# Patient Record
Sex: Male | Born: 1960 | Race: White | Hispanic: No | Marital: Married | Smoking: Never smoker
Health system: Southern US, Community
[De-identification: ages and names within clinical notes are randomized; demographics above are authoritative.]

## PROBLEM LIST (undated history)

## (undated) DIAGNOSIS — I10 Essential (primary) hypertension: Secondary | ICD-10-CM

## (undated) DIAGNOSIS — R519 Headache, unspecified: Secondary | ICD-10-CM

## (undated) DIAGNOSIS — M542 Cervicalgia: Secondary | ICD-10-CM

## (undated) DIAGNOSIS — Z87442 Personal history of urinary calculi: Secondary | ICD-10-CM

## (undated) DIAGNOSIS — G4730 Sleep apnea, unspecified: Secondary | ICD-10-CM

## (undated) DIAGNOSIS — M1990 Unspecified osteoarthritis, unspecified site: Secondary | ICD-10-CM

## (undated) DIAGNOSIS — F419 Anxiety disorder, unspecified: Secondary | ICD-10-CM

## (undated) DIAGNOSIS — F329 Major depressive disorder, single episode, unspecified: Secondary | ICD-10-CM

## (undated) DIAGNOSIS — F32A Depression, unspecified: Secondary | ICD-10-CM

## (undated) DIAGNOSIS — A4901 Methicillin susceptible Staphylococcus aureus infection, unspecified site: Secondary | ICD-10-CM

## (undated) HISTORY — PX: SHOULDER SURGERY: SHX246

## (undated) HISTORY — PX: HAND SURGERY: SHX662

---
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
## 2019-01-05 ENCOUNTER — Ambulatory Visit (INDEPENDENT_AMBULATORY_CARE_PROVIDER_SITE_OTHER): Payer: Medicare PPO

## 2019-01-05 ENCOUNTER — Other Ambulatory Visit: Payer: Self-pay

## 2019-01-05 ENCOUNTER — Ambulatory Visit (INDEPENDENT_AMBULATORY_CARE_PROVIDER_SITE_OTHER): Payer: Medicare PPO | Admitting: Orthopaedic Surgery

## 2019-01-05 VITALS — Ht 72.0 in | Wt 260.0 lb

## 2019-01-05 DIAGNOSIS — M1612 Unilateral primary osteoarthritis, left hip: Secondary | ICD-10-CM | POA: Diagnosis not present

## 2019-01-05 DIAGNOSIS — M545 Low back pain, unspecified: Secondary | ICD-10-CM
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
## 2019-01-13 NOTE — Progress Notes (Signed)
[facility] [place], [address] AT [place] [address] Phone: [phone] Fax: [phone]      Your procedure is scheduled on June 22nd.  Report to [facility] Main Entrance "A" at 8:15 A.M., and check in at the Admitting office.  Call this number if you have problems the morning of surgery:  [phone]  Call [phone] if you have any questions prior to your surgery date Monday-Friday 8am-4pm    Remember:  Do not eat after midnight.  You may drink clear liquids until 7:15AM .   Clear liquids allowed are: Water, Non-Citrus Juices (without pulp), Carbonated Beverages, Clear Tea, Black Coffee Only, and Gatorade  Please complete your PRE-SURGERY ENSURE that was provided to you 3 hours prior to you surgery start time (finish by 7:15 AM).  Please, if able, drink it in one setting. DO NOT SIP.     Take these medicines the morning of surgery with A SIP OF WATER   Tylenol - if needed  Amlodipine (Norvasc)  Gabapentin (Neurontin)  Pravastatin (Pravachol)  7 days prior to surgery STOP taking any Aspirin (unless otherwise instructed by your surgeon), Aleve, Naproxen, Ibuprofen, Motrin, Advil, Goody's, BC's, all herbal medications, fish oil, and all vitamins.    The Morning of Surgery  Do not wear jewelry, make-up or nail polish.  Do not wear lotions, powders, colognes, or deodorant  Do not shave 48 hours prior to surgery.  Men may shave face and neck.  Do not bring valuables to the hospital.  [facility] is not responsible for any belongings or valuables.  If you are a smoker, DO NOT Smoke 24 hours prior to surgery IF you wear a CPAP at night please bring your mask, tubing, and machine the morning of surgery   Remember that you must have someone to transport you home after your surgery, and remain with you for 24 hours if you are discharged the same day.   Contacts, glasses,  hearing aids, dentures or bridgework may not be worn into surgery.    Leave your suitcase in the car.  After surgery it may be brought to your room.  For patients admitted to the hospital, discharge time will be determined by your treatment team.  Patients discharged the day of surgery will not be allowed to drive home.    Special instructions:   [facility] [place]- Preparing For Surgery  Before surgery, you can play an important role. Because skin is not sterile, your skin needs to be as free of germs as possible. You can reduce the number of germs on your skin by washing with CHG (chlorahexidine gluconate) Soap before surgery.  CHG is an antiseptic cleaner which kills germs and bonds with the skin to continue killing germs even after washing.    Oral Hygiene is also important to reduce your risk of infection.  Remember - BRUSH YOUR TEETH THE MORNING OF SURGERY WITH YOUR REGULAR TOOTHPASTE  Please do not use if you have an allergy to CHG or antibacterial soaps. If your skin becomes reddened/irritated stop using the CHG.  Do not shave (including legs and underarms) for at least 48 hours prior to first CHG shower. It is OK to shave your face.  Please follow these instructions carefully.   1. Shower the NIGHT BEFORE SURGERY and the MORNING OF SURGERY with CHG Soap.   2. If you chose to wash your hair, wash  your hair first as usual with your normal shampoo.  3. After you shampoo, rinse your hair and body thoroughly to remove the shampoo.  4. Use CHG as you would any other liquid soap. You can apply CHG directly to the skin and wash gently with a scrungie or a clean washcloth.   5. Apply the CHG Soap to your body ONLY FROM THE NECK DOWN.  Do not use on open wounds or open sores. Avoid contact with your eyes, ears, mouth and genitals (private parts). Wash Face and genitals (private parts)  with your normal soap.   6. Wash thoroughly, paying special attention to the area where your surgery will be  performed.  7. Thoroughly rinse your body with warm water from the neck down.  8. DO NOT shower/wash with your normal soap after using and rinsing off the CHG Soap.  9. Pat yourself dry with a CLEAN TOWEL.  10. Wear CLEAN PAJAMAS to bed the night before surgery, wear comfortable clothes the morning of surgery  11. Place CLEAN SHEETS on your bed the night of your first shower and DO NOT SLEEP WITH PETS.    Day of Surgery:  Do not apply any deodorants/lotions.  Please wear clean clothes to the hospital/surgery center.   Remember to brush your teeth WITH YOUR REGULAR TOOTHPASTE.   Please read over the following fact sheets that you were given.

## 2019-01-14 ENCOUNTER — Other Ambulatory Visit (HOSPITAL_COMMUNITY)
Admission: RE | Admit: 2019-01-14 | Discharge: 2019-01-14 | Disposition: A | Discharge status: Home / Self Care | Payer: Medicare PPO | Source: Ambulatory Visit | Attending: Orthopaedic Surgery | Admitting: Orthopaedic Surgery

## 2019-01-14 ENCOUNTER — Encounter (HOSPITAL_COMMUNITY)
Admission: RE | Admit: 2019-01-14 | Discharge: 2019-01-14 | Disposition: A | Discharge status: Home / Self Care | Payer: Medicare PPO | Source: Ambulatory Visit | Attending: Orthopaedic Surgery | Admitting: Orthopaedic Surgery

## 2019-01-14 ENCOUNTER — Other Ambulatory Visit: Payer: Self-pay

## 2019-01-14 ENCOUNTER — Encounter (HOSPITAL_COMMUNITY)
Admission: RE | Admit: 2019-01-14 | Discharge: 2019-01-14 | Disposition: A | Discharge status: Home / Self Care | Payer: Medicare PPO | Source: Ambulatory Visit | Attending: Physician Assistant | Admitting: Physician Assistant

## 2019-01-14 ENCOUNTER — Encounter (HOSPITAL_COMMUNITY): Payer: Self-pay

## 2019-01-14 DIAGNOSIS — M1612 Unilateral primary osteoarthritis, left hip: Secondary | ICD-10-CM | POA: Diagnosis not present

## 2019-01-14 DIAGNOSIS — J9811 Atelectasis: Secondary | ICD-10-CM | POA: Insufficient documentation

## 2019-01-14 DIAGNOSIS — Z01818 Encounter for other preprocedural examination: Secondary | ICD-10-CM | POA: Insufficient documentation

## 2019-01-14 HISTORY — DX: Anxiety disorder, unspecified: F41.9

## 2019-01-14 HISTORY — DX: Personal history of urinary calculi: Z87.442

## 2019-01-14 HISTORY — DX: Unspecified osteoarthritis, unspecified site: M19.90

## 2019-01-14 HISTORY — DX: Depression, unspecified: F32.A

## 2019-01-14 HISTORY — DX: Cervicalgia: M54.2

## 2019-01-14 HISTORY — DX: Sleep apnea, unspecified: G47.30

## 2019-01-14 LAB — COMPREHENSIVE METABOLIC PANEL
ALT: 24 U/L (ref 0–44)
AST: 23 U/L (ref 15–41)
Albumin: 4.4 g/dL (ref 3.5–5.0)
Alkaline Phosphatase: 84 U/L (ref 38–126)
Anion gap: 9 (ref 5–15)
BUN: 19 mg/dL (ref 6–20)
CO2: 24 mmol/L (ref 22–32)
Calcium: 9.5 mg/dL (ref 8.9–10.3)
Chloride: 105 mmol/L (ref 98–111)
Creatinine, Ser: 1.22 mg/dL (ref 0.61–1.24)
GFR calc Af Amer: >60 mL/min (ref >60)
GFR calc non Af Amer: >60 mL/min (ref >60)
Glucose, Bld: 106 mg/dL — ABNORMAL HIGH (ref 70–99)
Potassium: 4.6 mmol/L (ref 3.5–5.1)
Sodium: 138 mmol/L (ref 135–145)
Total Bilirubin: 1 mg/dL (ref 0.3–1.2)
Total Protein: 7.8 g/dL (ref 6.5–8.1)

## 2019-01-14 LAB — SARS CORONAVIRUS 2 (TAT 6-24 HRS): SARS Coronavirus 2: NEGATIVE

## 2019-01-14 LAB — CBC WITH DIFFERENTIAL/PLATELET
Abs Immature Granulocytes: 0.02 10*3/uL (ref 0.00–0.07)
Basophils Absolute: 0.1 10*3/uL (ref 0.0–0.1)
Basophils Relative: 1 %
Eosinophils Absolute: 0.2 10*3/uL (ref 0.0–0.5)
Eosinophils Relative: 3 %
HCT: 48 % (ref 39.0–52.0)
Hemoglobin: 14.8 g/dL (ref 13.0–17.0)
Immature Granulocytes: 0 %
Lymphocytes Relative: 29 %
Lymphs Abs: 2.4 10*3/uL (ref 0.7–4.0)
MCH: 27.8 pg (ref 26.0–34.0)
MCHC: 30.8 g/dL (ref 30.0–36.0)
MCV: 90.1 fL (ref 80.0–100.0)
Monocytes Absolute: 0.7 10*3/uL (ref 0.1–1.0)
Monocytes Relative: 9 %
Neutro Abs: 5 10*3/uL (ref 1.7–7.7)
Neutrophils Relative %: 58 %
Platelets: 255 10*3/uL (ref 150–400)
RBC: 5.33 MIL/uL (ref 4.22–5.81)
RDW: 13 % (ref 11.5–15.5)
WBC: 8.5 10*3/uL (ref 4.0–10.5)
nRBC: 0 % (ref 0.0–0.2)

## 2019-01-14 LAB — ABO/RH: ABO/RH(D): O NEG

## 2019-01-14 LAB — TYPE AND SCREEN
ABO/RH(D): O NEG
Antibody Screen: NEGATIVE

## 2019-01-14 LAB — PROTIME-INR
INR: 1 (ref 0.8–1.2)
Prothrombin Time: 13.4 seconds (ref 11.4–15.2)

## 2019-01-14 LAB — SURGICAL PCR SCREEN
MRSA, PCR: NEGATIVE
Staphylococcus aureus: POSITIVE — AB

## 2019-01-14 LAB — APTT: aPTT: 35 seconds (ref 24–36)

## 2019-01-14 NOTE — Progress Notes (Signed)
PCP - . Thornton Papas ,FNP Cardiologist - n/a  Chest x-ray - none EKG - none Stress Test - n/a ECHO - n/a Cardiac Cath - n/a  Sleep Study -yes  Positive home study   CPAP -to be discussed after surgery   Fasting Blood Sugar -n/a  Checks Blood Sugar _____ times a day  Blood Thinner Instructions:n/a Aspirin Instructions:on hold  Anesthesia review:   Patient denies shortness of breath, fever, cough and chest pain at PAT appointment   Patient verbalized understanding of instructions that were given to them at the PAT appointment. Patient was also instructed that they will need to review over the PAT instructions again at home before surgery.

## 2019-01-15 MED ORDER — DEXTROSE 5 % IV SOLN
3.0000 g | INTRAVENOUS | Status: AC
  Administered 2019-01-18: 3 g via INTRAVENOUS
  Filled 2019-01-15: qty 3

## 2019-01-15 MED ORDER — TRANEXAMIC ACID 1000 MG/10ML IV SOLN
2000.0000 mg | INTRAVENOUS | Status: AC
  Administered 2019-01-18: 2000 mg via TOPICAL
  Filled 2019-01-15: qty 20

## 2019-01-15 MED ORDER — TRANEXAMIC ACID-NACL 1000-0.7 MG/100ML-% IV SOLN
1000.0000 mg | INTRAVENOUS | Status: AC
  Administered 2019-01-18: 1000 mg via INTRAVENOUS
  Filled 2019-01-15: qty 100

## 2019-01-15 NOTE — Progress Notes (Signed)
Will you make sure mupirocin nasal has been called in by today

## 2019-01-18 ENCOUNTER — Ambulatory Visit (HOSPITAL_COMMUNITY): Payer: Medicare PPO | Admitting: Anesthesiology

## 2019-01-18 ENCOUNTER — Ambulatory Visit (HOSPITAL_COMMUNITY): Payer: Medicare PPO

## 2019-01-18 ENCOUNTER — Observation Stay (HOSPITAL_COMMUNITY)
Admission: RE | Admit: 2019-01-18 | Discharge: 2019-01-19 | Disposition: A | Discharge status: Home / Self Care | Payer: Medicare PPO | Attending: Orthopaedic Surgery | Admitting: Orthopaedic Surgery

## 2019-01-18 ENCOUNTER — Encounter (HOSPITAL_COMMUNITY): Payer: Self-pay

## 2019-01-18 ENCOUNTER — Ambulatory Visit (HOSPITAL_COMMUNITY): Payer: Medicare PPO | Admitting: Vascular Surgery

## 2019-01-18 ENCOUNTER — Other Ambulatory Visit: Payer: Self-pay

## 2019-01-18 ENCOUNTER — Encounter (HOSPITAL_COMMUNITY)
Admission: RE | Disposition: A | Discharge status: Home / Self Care | Payer: Self-pay | Source: Home / Self Care | Attending: Orthopaedic Surgery

## 2019-01-18 DIAGNOSIS — Z7982 Long term (current) use of aspirin: Secondary | ICD-10-CM | POA: Diagnosis not present

## 2019-01-18 DIAGNOSIS — F329 Major depressive disorder, single episode, unspecified: Secondary | ICD-10-CM | POA: Insufficient documentation

## 2019-01-18 DIAGNOSIS — G4730 Sleep apnea, unspecified: Secondary | ICD-10-CM | POA: Insufficient documentation

## 2019-01-18 DIAGNOSIS — Z96649 Presence of unspecified artificial hip joint: Secondary | ICD-10-CM

## 2019-01-18 DIAGNOSIS — Z79899 Other long term (current) drug therapy: Secondary | ICD-10-CM | POA: Diagnosis not present

## 2019-01-18 DIAGNOSIS — Z419 Encounter for procedure for purposes other than remedying health state, unspecified: Secondary | ICD-10-CM

## 2019-01-18 DIAGNOSIS — F419 Anxiety disorder, unspecified: Secondary | ICD-10-CM | POA: Insufficient documentation

## 2019-01-18 DIAGNOSIS — M1612 Unilateral primary osteoarthritis, left hip: Secondary | ICD-10-CM | POA: Diagnosis not present

## 2019-01-18 DIAGNOSIS — Z96642 Presence of left artificial hip joint: Secondary | ICD-10-CM

## 2019-01-18 HISTORY — PX: TOTAL HIP ARTHROPLASTY: SHX124

## 2019-01-18 HISTORY — DX: Essential (primary) hypertension: I10

## 2019-01-18 SURGERY — ARTHROPLASTY, HIP, TOTAL, ANTERIOR APPROACH
Anesthesia: General | Site: Hip | Laterality: Left

## 2019-01-18 MED ORDER — MAGNESIUM CITRATE PO SOLN: 1.0000 | Freq: Once | ORAL | Status: DC | PRN

## 2019-01-18 MED ORDER — METOCLOPRAMIDE HCL 5 MG PO TABS: 5.0000 mg | ORAL_TABLET | Freq: Three times a day (TID) | ORAL | Status: DC | PRN

## 2019-01-18 MED ORDER — OXYCODONE HCL 5 MG PO TABS: 10.0000 mg | ORAL_TABLET | ORAL | Status: DC | PRN

## 2019-01-18 MED ORDER — ONDANSETRON HCL 4 MG/2ML IJ SOLN: 4.0000 mg | Freq: Four times a day (QID) | INTRAMUSCULAR | Status: DC | PRN

## 2019-01-18 MED ORDER — ALUM & MAG HYDROXIDE-SIMETH 200-200-20 MG/5ML PO SUSP: 30.0000 mL | ORAL | Status: DC | PRN

## 2019-01-18 MED ORDER — TRANEXAMIC ACID-NACL 1000-0.7 MG/100ML-% IV SOLN
1000.0000 mg | Freq: Once | INTRAVENOUS | Status: AC
  Administered 2019-01-18: 1000 mg via INTRAVENOUS
  Filled 2019-01-18: qty 100

## 2019-01-18 MED ORDER — GABAPENTIN 400 MG PO CAPS
800.0000 mg | ORAL_CAPSULE | Freq: Four times a day (QID) | ORAL | Status: DC
  Administered 2019-01-18 – 2019-01-19 (×4): 800 mg via ORAL
  Filled 2019-01-18 (×4): qty 2

## 2019-01-18 MED ORDER — TRANEXAMIC ACID-NACL 1000-0.7 MG/100ML-% IV SOLN
1000.0000 mg | Freq: Once | INTRAVENOUS | Status: DC
  Filled 2019-01-18: qty 100

## 2019-01-18 MED ORDER — HYDROMORPHONE HCL 1 MG/ML IJ SOLN
0.5000 mg | INTRAMUSCULAR | Status: DC | PRN
  Administered 2019-01-18 – 2019-01-19 (×2): 1 mg via INTRAVENOUS
  Filled 2019-01-18 (×2): qty 1

## 2019-01-18 MED ORDER — GABAPENTIN 300 MG PO CAPS: 300.0000 mg | ORAL_CAPSULE | Freq: Three times a day (TID) | ORAL | Status: DC

## 2019-01-18 MED ORDER — PRAVASTATIN SODIUM 40 MG PO TABS
40.0000 mg | ORAL_TABLET | Freq: Every day | ORAL | Status: DC
  Administered 2019-01-19: 40 mg via ORAL
  Filled 2019-01-18: qty 1

## 2019-01-18 MED ORDER — ONDANSETRON HCL 4 MG PO TABS: 4.0000 mg | ORAL_TABLET | Freq: Four times a day (QID) | ORAL | Status: DC | PRN

## 2019-01-18 MED ORDER — OXYCODONE HCL ER 10 MG PO T12A
10.0000 mg | EXTENDED_RELEASE_TABLET | Freq: Two times a day (BID) | ORAL | Status: DC
  Filled 2019-01-18 (×2): qty 1

## 2019-01-18 MED ORDER — PHENOL 1.4 % MT LIQD: 1.0000 | OROMUCOSAL | Status: DC | PRN

## 2019-01-18 MED ORDER — SUCCINYLCHOLINE CHLORIDE 200 MG/10ML IV SOSY
PREFILLED_SYRINGE | INTRAVENOUS | Status: DC | PRN
  Administered 2019-01-18: 130 mg via INTRAVENOUS

## 2019-01-18 MED ORDER — LISINOPRIL 20 MG PO TABS
20.0000 mg | ORAL_TABLET | Freq: Every day | ORAL | Status: DC
  Administered 2019-01-19: 20 mg via ORAL
  Filled 2019-01-18: qty 1

## 2019-01-18 MED ORDER — SUCCINYLCHOLINE CHLORIDE 200 MG/10ML IV SOSY
PREFILLED_SYRINGE | INTRAVENOUS | Status: AC
  Filled 2019-01-18: qty 10

## 2019-01-18 MED ORDER — SODIUM CHLORIDE 0.9 % IV SOLN
INTRAVENOUS | Status: DC
  Administered 2019-01-18: 14:00:00 via INTRAVENOUS

## 2019-01-18 MED ORDER — KETOROLAC TROMETHAMINE 15 MG/ML IJ SOLN
30.0000 mg | Freq: Four times a day (QID) | INTRAMUSCULAR | Status: AC
  Administered 2019-01-18 – 2019-01-19 (×4): 30 mg via INTRAVENOUS
  Filled 2019-01-18 (×3): qty 2

## 2019-01-18 MED ORDER — AMLODIPINE BESYLATE 10 MG PO TABS
10.0000 mg | ORAL_TABLET | Freq: Every evening | ORAL | Status: DC
  Administered 2019-01-18: 18:00:00 10 mg via ORAL
  Filled 2019-01-18: qty 1

## 2019-01-18 MED ORDER — OXYCODONE HCL ER 10 MG PO T12A: 10.0000 mg | EXTENDED_RELEASE_TABLET | Freq: Two times a day (BID) | ORAL | 0 refills | Status: AC

## 2019-01-18 MED ORDER — OXYCODONE HCL 5 MG PO TABS
5.0000 mg | ORAL_TABLET | Freq: Once | ORAL | Status: AC | PRN
  Administered 2019-01-18: 5 mg via ORAL

## 2019-01-18 MED ORDER — ASPIRIN EC 81 MG PO TBEC: 81.0000 mg | DELAYED_RELEASE_TABLET | Freq: Two times a day (BID) | ORAL | 0 refills | Status: DC

## 2019-01-18 MED ORDER — OXYCODONE HCL 5 MG PO TABS: 5.0000 mg | ORAL_TABLET | Freq: Four times a day (QID) | ORAL | 0 refills | Status: DC | PRN

## 2019-01-18 MED ORDER — VANCOMYCIN HCL 1 G IV SOLR
INTRAVENOUS | Status: DC | PRN
  Administered 2019-01-18: 1000 mg via TOPICAL

## 2019-01-18 MED ORDER — ROCURONIUM BROMIDE 10 MG/ML (PF) SYRINGE
PREFILLED_SYRINGE | INTRAVENOUS | Status: AC
  Filled 2019-01-18: qty 10

## 2019-01-18 MED ORDER — OXYCODONE HCL 5 MG/5ML PO SOLN: 5.0000 mg | Freq: Once | ORAL | Status: AC | PRN

## 2019-01-18 MED ORDER — DIPHENHYDRAMINE HCL 12.5 MG/5ML PO ELIX: 25.0000 mg | ORAL_SOLUTION | ORAL | Status: DC | PRN

## 2019-01-18 MED ORDER — CHLORHEXIDINE GLUCONATE 4 % EX LIQD: 60.0000 mL | Freq: Once | CUTANEOUS | Status: DC

## 2019-01-18 MED ORDER — POLYETHYLENE GLYCOL 3350 17 G PO PACK: 17.0000 g | PACK | Freq: Every day | ORAL | Status: DC | PRN

## 2019-01-18 MED ORDER — SODIUM CHLORIDE 0.9 % IR SOLN
Status: DC | PRN
  Administered 2019-01-18: 3000 mL

## 2019-01-18 MED ORDER — PHENYLEPHRINE 40 MCG/ML (10ML) SYRINGE FOR IV PUSH (FOR BLOOD PRESSURE SUPPORT)
PREFILLED_SYRINGE | INTRAVENOUS | Status: DC | PRN
  Administered 2019-01-18: 120 ug via INTRAVENOUS

## 2019-01-18 MED ORDER — PROPOFOL 10 MG/ML IV BOLUS
INTRAVENOUS | Status: AC
  Filled 2019-01-18: qty 40

## 2019-01-18 MED ORDER — FENTANYL CITRATE (PF) 100 MCG/2ML IJ SOLN
INTRAMUSCULAR | Status: AC
  Filled 2019-01-18: qty 2

## 2019-01-18 MED ORDER — DULOXETINE HCL 30 MG PO CPEP
30.0000 mg | ORAL_CAPSULE | Freq: Every evening | ORAL | Status: DC
  Administered 2019-01-18: 18:00:00 30 mg via ORAL
  Filled 2019-01-18: qty 1

## 2019-01-18 MED ORDER — MENTHOL 3 MG MT LOZG: 1.0000 | LOZENGE | OROMUCOSAL | Status: DC | PRN

## 2019-01-18 MED ORDER — MIDAZOLAM HCL 2 MG/2ML IJ SOLN
INTRAMUSCULAR | Status: AC
  Filled 2019-01-18: qty 2

## 2019-01-18 MED ORDER — CEFAZOLIN SODIUM-DEXTROSE 2-4 GM/100ML-% IV SOLN
2.0000 g | Freq: Four times a day (QID) | INTRAVENOUS | Status: AC
  Administered 2019-01-18 – 2019-01-19 (×3): 2 g via INTRAVENOUS
  Filled 2019-01-18 (×3): qty 100

## 2019-01-18 MED ORDER — LACTATED RINGERS IV SOLN
INTRAVENOUS | Status: DC
  Administered 2019-01-18 (×2): via INTRAVENOUS

## 2019-01-18 MED ORDER — KETOROLAC TROMETHAMINE 30 MG/ML IJ SOLN
INTRAMUSCULAR | Status: AC
  Filled 2019-01-18: qty 1

## 2019-01-18 MED ORDER — DEXAMETHASONE SODIUM PHOSPHATE 10 MG/ML IJ SOLN
INTRAMUSCULAR | Status: DC | PRN
  Administered 2019-01-18: 10 mg via INTRAVENOUS

## 2019-01-18 MED ORDER — ACETAMINOPHEN 500 MG PO TABS
1000.0000 mg | ORAL_TABLET | Freq: Four times a day (QID) | ORAL | Status: AC
  Administered 2019-01-18 – 2019-01-19 (×4): 1000 mg via ORAL
  Filled 2019-01-18 (×4): qty 2

## 2019-01-18 MED ORDER — FENTANYL CITRATE (PF) 250 MCG/5ML IJ SOLN
INTRAMUSCULAR | Status: AC
  Filled 2019-01-18: qty 5

## 2019-01-18 MED ORDER — METOCLOPRAMIDE HCL 5 MG/ML IJ SOLN: 5.0000 mg | Freq: Three times a day (TID) | INTRAMUSCULAR | Status: DC | PRN

## 2019-01-18 MED ORDER — ACETAMINOPHEN 325 MG PO TABS
325.0000 mg | ORAL_TABLET | Freq: Four times a day (QID) | ORAL | Status: DC | PRN
  Administered 2019-01-19: 13:00:00 650 mg via ORAL
  Filled 2019-01-18: qty 2

## 2019-01-18 MED ORDER — PROMETHAZINE HCL 25 MG PO TABS: 25.0000 mg | ORAL_TABLET | Freq: Four times a day (QID) | ORAL | 1 refills | Status: DC | PRN

## 2019-01-18 MED ORDER — ONDANSETRON HCL 4 MG/2ML IJ SOLN
INTRAMUSCULAR | Status: AC
  Filled 2019-01-18: qty 2

## 2019-01-18 MED ORDER — VANCOMYCIN HCL 1000 MG IV SOLR
INTRAVENOUS | Status: AC
  Filled 2019-01-18: qty 1000

## 2019-01-18 MED ORDER — METHOCARBAMOL 1000 MG/10ML IJ SOLN
500.0000 mg | Freq: Four times a day (QID) | INTRAVENOUS | Status: DC | PRN
  Filled 2019-01-18: qty 5

## 2019-01-18 MED ORDER — GABAPENTIN 800 MG PO TABS
800.0000 mg | ORAL_TABLET | Freq: Four times a day (QID) | ORAL | Status: DC
  Filled 2019-01-18 (×3): qty 1

## 2019-01-18 MED ORDER — LIDOCAINE 2% (20 MG/ML) 5 ML SYRINGE
INTRAMUSCULAR | Status: AC
  Filled 2019-01-18: qty 5

## 2019-01-18 MED ORDER — ROCURONIUM BROMIDE 10 MG/ML (PF) SYRINGE
PREFILLED_SYRINGE | INTRAVENOUS | Status: DC | PRN
  Administered 2019-01-18: 50 mg via INTRAVENOUS
  Administered 2019-01-18: 10 mg via INTRAVENOUS

## 2019-01-18 MED ORDER — MIDAZOLAM HCL 5 MG/5ML IJ SOLN
INTRAMUSCULAR | Status: DC | PRN
  Administered 2019-01-18: 2 mg via INTRAVENOUS

## 2019-01-18 MED ORDER — DEXAMETHASONE SODIUM PHOSPHATE 10 MG/ML IJ SOLN
INTRAMUSCULAR | Status: AC
  Filled 2019-01-18: qty 1

## 2019-01-18 MED ORDER — 0.9 % SODIUM CHLORIDE (POUR BTL) OPTIME
TOPICAL | Status: DC | PRN
  Administered 2019-01-18: 1000 mL

## 2019-01-18 MED ORDER — ASPIRIN 81 MG PO CHEW
81.0000 mg | CHEWABLE_TABLET | Freq: Two times a day (BID) | ORAL | Status: DC
  Administered 2019-01-18 – 2019-01-19 (×2): 81 mg via ORAL
  Filled 2019-01-18 (×2): qty 1

## 2019-01-18 MED ORDER — ONDANSETRON HCL 4 MG PO TABS: 4.0000 mg | ORAL_TABLET | Freq: Three times a day (TID) | ORAL | 0 refills | Status: DC | PRN

## 2019-01-18 MED ORDER — METHOCARBAMOL 750 MG PO TABS: 750.0000 mg | ORAL_TABLET | Freq: Two times a day (BID) | ORAL | 0 refills | Status: DC | PRN

## 2019-01-18 MED ORDER — PROPOFOL 10 MG/ML IV BOLUS
INTRAVENOUS | Status: DC | PRN
  Administered 2019-01-18: 200 mg via INTRAVENOUS

## 2019-01-18 MED ORDER — OXYCODONE HCL 5 MG PO TABS
ORAL_TABLET | ORAL | Status: AC
  Filled 2019-01-18: qty 1

## 2019-01-18 MED ORDER — POVIDONE-IODINE 10 % EX SWAB: 2.0000 "application " | Freq: Once | CUTANEOUS | Status: DC

## 2019-01-18 MED ORDER — DEXAMETHASONE SODIUM PHOSPHATE 10 MG/ML IJ SOLN
10.0000 mg | Freq: Once | INTRAMUSCULAR | Status: AC
  Administered 2019-01-19: 10 mg via INTRAVENOUS
  Filled 2019-01-18: qty 1

## 2019-01-18 MED ORDER — SALINE SPRAY 0.65 % NA SOLN
1.0000 | NASAL | Status: DC | PRN
  Filled 2019-01-18: qty 44

## 2019-01-18 MED ORDER — FENTANYL CITRATE (PF) 100 MCG/2ML IJ SOLN
INTRAMUSCULAR | Status: DC | PRN
  Administered 2019-01-18 (×2): 50 ug via INTRAVENOUS
  Administered 2019-01-18: 100 ug via INTRAVENOUS
  Administered 2019-01-18: 50 ug via INTRAVENOUS

## 2019-01-18 MED ORDER — OXYCODONE HCL 5 MG PO TABS: 5.0000 mg | ORAL_TABLET | ORAL | Status: DC | PRN

## 2019-01-18 MED ORDER — SENNOSIDES-DOCUSATE SODIUM 8.6-50 MG PO TABS: 1.0000 | ORAL_TABLET | Freq: Every evening | ORAL | 1 refills | Status: DC | PRN

## 2019-01-18 MED ORDER — DOCUSATE SODIUM 100 MG PO CAPS
100.0000 mg | ORAL_CAPSULE | Freq: Two times a day (BID) | ORAL | Status: DC
  Administered 2019-01-18 – 2019-01-19 (×3): 100 mg via ORAL
  Filled 2019-01-18 (×3): qty 1

## 2019-01-18 MED ORDER — FENTANYL CITRATE (PF) 100 MCG/2ML IJ SOLN
25.0000 ug | INTRAMUSCULAR | Status: DC | PRN
  Administered 2019-01-18 (×3): 50 ug via INTRAVENOUS

## 2019-01-18 MED ORDER — SUGAMMADEX SODIUM 200 MG/2ML IV SOLN
INTRAVENOUS | Status: DC | PRN
  Administered 2019-01-18: 200 mg via INTRAVENOUS

## 2019-01-18 MED ORDER — LIDOCAINE 2% (20 MG/ML) 5 ML SYRINGE
INTRAMUSCULAR | Status: DC | PRN
  Administered 2019-01-18: 50 mg via INTRAVENOUS

## 2019-01-18 MED ORDER — SODIUM CHLORIDE 0.9 % IV SOLN
INTRAVENOUS | Status: DC | PRN
  Administered 2019-01-18: 10:00:00 50 ug/min via INTRAVENOUS

## 2019-01-18 MED ORDER — METHOCARBAMOL 500 MG PO TABS
ORAL_TABLET | ORAL | Status: AC
  Filled 2019-01-18: qty 1

## 2019-01-18 MED ORDER — METHOCARBAMOL 500 MG PO TABS
500.0000 mg | ORAL_TABLET | Freq: Four times a day (QID) | ORAL | Status: DC | PRN
  Administered 2019-01-18: 13:00:00 500 mg via ORAL

## 2019-01-18 MED ORDER — SORBITOL 70 % SOLN: 30.0000 mL | Freq: Every day | Status: DC | PRN

## 2019-01-18 MED ORDER — ONDANSETRON HCL 4 MG/2ML IJ SOLN
INTRAMUSCULAR | Status: DC | PRN
  Administered 2019-01-18: 4 mg via INTRAVENOUS

## 2019-01-18 SURGICAL SUPPLY — 58 items
ACETAB CUP W/GRIPTION 54 (Plate) ×2 IMPLANT
BAG DECANTER FOR FLEXI CONT (MISCELLANEOUS) ×2 IMPLANT
CELLS DAT CNTRL 66122 CELL SVR (MISCELLANEOUS) IMPLANT
COVER PERINEAL POST (MISCELLANEOUS) ×2 IMPLANT
COVER SURGICAL LIGHT HANDLE (MISCELLANEOUS) ×2 IMPLANT
COVER WAND RF STERILE (DRAPES) ×2 IMPLANT
CUP ACETAB W/GRIPTION 54 (Plate) IMPLANT
DRAPE C-ARM 42X72 X-RAY (DRAPES) ×2 IMPLANT
DRAPE POUCH INSTRU U-SHP 10X18 (DRAPES) ×2 IMPLANT
DRAPE STERI IOBAN 125X83 (DRAPES) ×2 IMPLANT
DRAPE U-SHAPE 47X51 STRL (DRAPES) ×4 IMPLANT
DRSG AQUACEL AG ADV 3.5X10 (GAUZE/BANDAGES/DRESSINGS) ×2 IMPLANT
DURAPREP 26ML APPLICATOR (WOUND CARE) ×4 IMPLANT
ELECT BLADE 4.0 EZ CLEAN MEGAD (MISCELLANEOUS) ×2
ELECT REM PT RETURN 9FT ADLT (ELECTROSURGICAL) ×2
ELECTRODE BLDE 4.0 EZ CLN MEGD (MISCELLANEOUS) ×1 IMPLANT
ELECTRODE REM PT RTRN 9FT ADLT (ELECTROSURGICAL) ×1 IMPLANT
GLOVE BIOGEL PI IND STRL 7.0 (GLOVE) ×1 IMPLANT
GLOVE BIOGEL PI INDICATOR 7.0 (GLOVE) ×1
GLOVE ECLIPSE 7.0 STRL STRAW (GLOVE) ×4 IMPLANT
GLOVE SKINSENSE NS SZ7.5 (GLOVE) ×1
GLOVE SKINSENSE STRL SZ7.5 (GLOVE) ×1 IMPLANT
GLOVE SURG SYN 7.5  E (GLOVE) ×4
GLOVE SURG SYN 7.5 E (GLOVE) ×4 IMPLANT
GLOVE SURG SYN 7.5 PF PI (GLOVE) ×4 IMPLANT
GOWN SRG XL XLNG 56XLVL 4 (GOWN DISPOSABLE) ×1 IMPLANT
GOWN STRL NON-REIN XL XLG LVL4 (GOWN DISPOSABLE) ×1
GOWN STRL REUS W/ TWL LRG LVL3 (GOWN DISPOSABLE) IMPLANT
GOWN STRL REUS W/ TWL XL LVL3 (GOWN DISPOSABLE) ×1 IMPLANT
GOWN STRL REUS W/TWL LRG LVL3 (GOWN DISPOSABLE)
GOWN STRL REUS W/TWL XL LVL3 (GOWN DISPOSABLE) ×1
HANDPIECE INTERPULSE COAX TIP (DISPOSABLE) ×1
HEAD CERAMIC DELTA 36 PLUS 1.5 (Hips) ×1 IMPLANT
HOOD PEEL AWAY FLYTE STAYCOOL (MISCELLANEOUS) ×4 IMPLANT
IV NS IRRIG 3000ML ARTHROMATIC (IV SOLUTION) ×2 IMPLANT
KIT BASIN OR (CUSTOM PROCEDURE TRAY) ×2 IMPLANT
LINER NEUTRAL 54X36MM PLUS 4 (Hips) ×1 IMPLANT
MARKER SKIN DUAL TIP RULER LAB (MISCELLANEOUS) ×2 IMPLANT
NDL SPNL 18GX3.5 QUINCKE PK (NEEDLE) ×1 IMPLANT
NEEDLE SPNL 18GX3.5 QUINCKE PK (NEEDLE) ×2 IMPLANT
PACK TOTAL JOINT (CUSTOM PROCEDURE TRAY) ×2 IMPLANT
PACK UNIVERSAL I (CUSTOM PROCEDURE TRAY) ×2 IMPLANT
RETRACTOR WND ALEXIS 18 MED (MISCELLANEOUS) IMPLANT
RTRCTR WOUND ALEXIS 18CM MED (MISCELLANEOUS)
SAW OSC TIP CART 19.5X105X1.3 (SAW) ×2 IMPLANT
SCREW 6.5MMX25MM (Screw) ×1 IMPLANT
SET HNDPC FAN SPRY TIP SCT (DISPOSABLE) ×1 IMPLANT
STAPLER VISISTAT 35W (STAPLE) IMPLANT
STEM FEMORAL SZ6 HIGH ACTIS (Stem) ×1 IMPLANT
SUT ETHIBOND 2 V 37 (SUTURE) ×2 IMPLANT
SUT VIC AB 1 CTX 36 (SUTURE) ×1
SUT VIC AB 1 CTX36XBRD ANBCTR (SUTURE) ×1 IMPLANT
SUT VIC AB 2-0 CT1 27 (SUTURE) ×2
SUT VIC AB 2-0 CT1 TAPERPNT 27 (SUTURE) ×2 IMPLANT
SYRINGE 60CC LL (MISCELLANEOUS) ×2 IMPLANT
TOWEL OR 17X26 10 PK STRL BLUE (TOWEL DISPOSABLE) ×2 IMPLANT
TRAY CATH 16FR W/PLASTIC CATH (SET/KITS/TRAYS/PACK) IMPLANT
YANKAUER SUCT BULB TIP NO VENT (SUCTIONS) ×2 IMPLANT

## 2019-01-18 NOTE — Social Work (Signed)
CSW acknowledging consult for SNF placement. Will follow for therapy recommendations.   Daley Mooradian, MSW, LCSWA [place] Clinical Social Work [phone]   

## 2019-01-18 NOTE — Op Note (Signed)
LEFT TOTAL HIP ARTHROPLASTY ANTERIOR APPROACH  Procedure Note Dylan [name]   [id number]  Pre-op Diagnosis: left hip degenerative joint disease     Post-op Diagnosis: same   Operative Procedures  1. Total hip replacement; Left hip; uncemented [id number]   Personnel  Surgeon(s): Tarry KosXu, Anahid Eskelson M, MD  Assist: Oneal GroutMary Lindsey Stanbery, PA-C; necessary for the timely completion of procedure and due to complexity of procedure.   Anesthesia: general  Prosthesis: Depuy Acetabulum: Pinnacle 54 mm Femur: Actis 6 HO Head: 36 mm size: +1.5 Liner: +4 neutral Bearing Type: ceramic on poly  Total Hip Arthroplasty (Anterior Approach) Op Note:  After informed consent was obtained and the operative extremity marked in the holding area, the patient was brought back to the operating room and placed supine on the HANA table. Next, the operative extremity was prepped and draped in normal sterile fashion. Surgical timeout occurred verifying patient identification, surgical site, surgical procedure and administration of antibiotics.  A modified anterior Smith-Peterson approach to the hip was performed, using the interval between tensor fascia lata and sartorius.  Dissection was carried bluntly down onto the anterior hip capsule. The lateral femoral circumflex vessels were identified and coagulated. A capsulotomy was performed and the capsular flaps tagged for later repair.  Fluoroscopy was utilized to prepare for the femoral neck cut. The neck osteotomy was performed. The femoral head was removed, the acetabular rim was cleared of soft tissue and attention was turned to reaming the acetabulum.  Sequential reaming was performed under fluoroscopic guidance. We reamed to a size 53 mm, and then impacted the acetabular shell. The liner was then placed after irrigation and attention turned to the femur.  After placing the femoral hook, the leg was taken to externally rotated, extended and adducted position taking care  to perform soft tissue releases to allow for adequate mobilization of the femur. Soft tissue was cleared from the shoulder of the greater trochanter and the hook elevator used to improve exposure of the proximal femur. Sequential broaching performed up to a size 6. Trial neck and head were placed. The leg was brought back up to neutral and the construct reduced. The position and sizing of components, offset and leg lengths were checked using fluoroscopy. Stability of the  construct was checked in extension and external rotation without any subluxation or impingement of prosthesis. We dislocated the prosthesis, dropped the leg back into position, removed trial components, and irrigated copiously. The final stem and head was then placed, the leg brought back up, the system reduced and fluoroscopy used to verify positioning.  We irrigated, obtained hemostasis and closed the capsule using #2 ethibond suture.  One gram of vancomycin powder was placed in the surgical bed. The fascia was closed with #1 vicryl plus, the deep fat layer was closed with 0 vicryl, the subcutaneous layers closed with 2.0 Vicryl Plus and the skin closed with 4.0 monocryl and steri strips. A sterile dressing was applied. The patient was awakened in the operating room and taken to recovery in stable condition.  All sponge, needle, and instrument counts were correct at the end of the case.   Position: supine  Complications: see description of procedure.  Time Out: performed   Drains/Packing: none  Estimated blood loss: see anesthesia record  Returned to Recovery Room: in good condition.   Antibiotics: yes   Mechanical VTE (DVT) Prophylaxis: sequential compression devices, TED thigh-high  Chemical VTE (DVT) Prophylaxis: aspirin   Fluid Replacement: see anesthesia record  Specimens Removed:  1 to pathology   Sponge and Instrument Count Correct? yes   PACU: portable radiograph - low AP   Plan/RTC: Return in 2 weeks for staple  removal. Weight Bearing/Load Lower Extremity: full  Hip precautions: none Suture Removal: 2 weeks   N. Eduard Roux, MD [place] 11:24 AM   Implant Name Type Inv. Item Serial No. Manufacturer Lot No. LRB No. Used Action  SCREW 6.5MMX25MM - [id number] Screw SCREW 6.5MMX25MM  DEPUY SYNTHES [id number] Left 1 Implanted  LINER NEUTRAL 54X36MM PLUS 4 - [id number] Hips LINER NEUTRAL 54X36MM PLUS 4  DEPUY SYNTHES [id number] Left 1 Implanted  ACETABULAR CUP W GRIPTION 54MM - [id number] Plate ACETABULAR CUP W GRIPTION 54MM  DEPUY SYNTHES [id number] Left 1 Implanted  HEAD CERAMIC DELTA 36 PLUS 1.5 - [id number] Hips HEAD CERAMIC DELTA 36 PLUS 1.5  DEPUY SYNTHES [id number] Left 1 Implanted  STEM FEMORAL SZ6 HIGH ACTIS - [id number] Stem STEM FEMORAL SZ6 HIGH ACTIS  [employer] DEPUY SPINE [id number] Left 1 Implanted

## 2019-01-18 NOTE — Transfer of Care (Signed)
Immediate Anesthesia Transfer of Care Note  Patient: Dylan [name]  Procedure(s) Performed: LEFT TOTAL HIP ARTHROPLASTY ANTERIOR APPROACH (Left Hip)  Patient Location: PACU  Anesthesia Type:General  Level of Consciousness: awake, oriented and patient cooperative  Airway & Oxygen Therapy: Patient Spontanous Breathing and Patient connected to face mask oxygen  Post-op Assessment: Report given to RN and Post -op Vital signs reviewed and stable  Post vital signs: Reviewed  Last Vitals:  Vitals Value Taken Time  BP 119/70 01/18/19 1203  Temp 36.2 C 01/18/19 1203  Pulse 94 01/18/19 1205  Resp 14 01/18/19 1205  SpO2 96 % 01/18/19 1205  Vitals shown include unvalidated device data.  Last Pain:  Vitals:   01/18/19 1203  TempSrc:   PainSc: (P) 7       Patients Stated Pain Goal: (P) 3 (38/75/64 3329)  Complications: No apparent anesthesia complications
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
## 2019-01-18 NOTE — Evaluation (Signed)
Physical Therapy Evaluation Patient Details Name: Dylan [name] MRN: [id number] DOB: Oct 11, 1960 Today's Date: 01/18/2019   History of Present Illness  Pt is a 58 y/o male s/p elective L THA, direct anterior approach. PMH includes sleep apnea.   Clinical Impression  Pt is s/p surgery above with deficits below. Pt tolerated gait training well and required min guard A for mobility tasks using RW. Will continue to follow acutely to maximize functional mobility independence and safety.     Follow Up Recommendations Follow surgeon's recommendation for DC plan and follow-up therapies;Supervision for mobility/OOB    Equipment Recommendations  Rolling walker with 5" wheels;3in1 (PT)    Recommendations for Other Services       Precautions / Restrictions Precautions Precautions: None Restrictions Weight Bearing Restrictions: Yes RLE Weight Bearing: Weight bearing as tolerated LLE Weight Bearing: Weight bearing as tolerated      Mobility  Bed Mobility Overal bed mobility: Needs Assistance Bed Mobility: Supine to Sit;Sit to Supine     Supine to sit: Supervision Sit to supine: Min assist   General bed mobility comments: Supervision and increased time when coming to EOB. Min A for LLE assist for return to supine.   Transfers Overall transfer level: Needs assistance Equipment used: Rolling walker (2 wheeled) Transfers: Sit to/from Stand Sit to Stand: Min guard         General transfer comment: Min guard for safety. Cues for safe hand placement.   Ambulation/Gait Ambulation/Gait assistance: Min guard Gait Distance (Feet): 100 Feet Assistive device: Rolling walker (2 wheeled) Gait Pattern/deviations: Step-to pattern;Step-through pattern;Decreased step length - left;Decreased step length - right;Decreased weight shift to left;Antalgic Gait velocity: Decreased    General Gait Details: Slow, antalgic gait. Cues for sequencing using RW. Improved weightshift to LLE noted at end of  gait.   Stairs            Wheelchair Mobility    Modified Rankin (Stroke Patients Only)       Balance Overall balance assessment: Needs assistance Sitting-balance support: No upper extremity supported;Feet supported Sitting balance-Leahy Scale: Good     Standing balance support: Bilateral upper extremity supported;During functional activity Standing balance-Leahy Scale: Poor Standing balance comment: Reliant on BUE support                              Pertinent Vitals/Pain Pain Assessment: Faces Faces Pain Scale: Hurts little more Pain Location: L hip  Pain Descriptors / Indicators: Aching;Operative site guarding Pain Intervention(s): Limited activity within patient's tolerance;Monitored during session;Repositioned    Home Living Family/patient expects to be discharged to:: Private residence Living Arrangements: Spouse/significant other Available Help at Discharge: Family;Available 24 hours/day Type of Home: House Home Access: Stairs to enter Entrance Stairs-Rails: Psychiatric nurse of Steps: 3 Home Layout: One level Home Equipment: None      Prior Function Level of Independence: Independent               Hand Dominance        Extremity/Trunk Assessment   Upper Extremity Assessment Upper Extremity Assessment: Overall WFL for tasks assessed    Lower Extremity Assessment Lower Extremity Assessment: LLE deficits/detail LLE Deficits / Details: Deficits consistent with post op pain and weakness.     Cervical / Trunk Assessment Cervical / Trunk Assessment: Normal  Communication   Communication: No difficulties  Cognition Arousal/Alertness: Awake/alert Behavior During Therapy: WFL for tasks assessed/performed Overall Cognitive Status: Within Functional Limits for  tasks assessed                                        General Comments      Exercises     Assessment/Plan    PT Assessment Patient  needs continued PT services  PT Problem List Decreased strength;Decreased balance;Decreased mobility;Decreased knowledge of use of DME;Decreased knowledge of precautions;Pain       PT Treatment Interventions DME instruction;Gait training;Functional mobility training;Therapeutic activities;Stair training;Therapeutic exercise;Balance training;Patient/family education    PT Goals (Current goals can be found in the Care Plan section)  Acute Rehab PT Goals Patient Stated Goal: to go home PT Goal Formulation: With patient Time For Goal Achievement: 02/01/19 Potential to Achieve Goals: Good    Frequency 7X/week   Barriers to discharge        Co-evaluation               AM-PAC PT "6 Clicks" Mobility  Outcome Measure Help needed turning from your back to your side while in a flat bed without using bedrails?: A Little Help needed moving from lying on your back to sitting on the side of a flat bed without using bedrails?: A Little Help needed moving to and from a bed to a chair (including a wheelchair)?: A Little Help needed standing up from a chair using your arms (e.g., wheelchair or bedside chair)?: A Little Help needed to walk in hospital room?: A Little Help needed climbing 3-5 steps with a railing? : A Lot 6 Click Score: 17    End of Session Equipment Utilized During Treatment: Gait belt Activity Tolerance: Patient tolerated treatment well Patient left: in bed;with call bell/phone within reach Nurse Communication: Mobility status PT Visit Diagnosis: Other abnormalities of gait and mobility (R26.89);Pain Pain - Right/Left: Left Pain - part of body: Hip    Time: 1455-1516 PT Time Calculation (min) (ACUTE ONLY): 21 min   Charges:   PT Evaluation $PT Eval Low Complexity: 1 Low          Gladys DammeBrittany Ewin Rehberg, PT, DPT  Acute Rehabilitation Services  Pager: [phone] Office: [phone]   Lehman PromBrittany S Anyjah Roundtree 01/18/2019, 4:27 PM

## 2019-01-18 NOTE — Anesthesia Postprocedure Evaluation (Signed)
Anesthesia Post Note  Patient: Dylan [name]  Procedure(s) Performed: LEFT TOTAL HIP ARTHROPLASTY ANTERIOR APPROACH (Left Hip)     Patient location during evaluation: PACU Anesthesia Type: General Level of consciousness: awake and alert Pain management: pain level controlled Vital Signs Assessment: post-procedure vital signs reviewed and stable Respiratory status: spontaneous breathing, nonlabored ventilation, respiratory function stable and patient connected to nasal cannula oxygen Cardiovascular status: blood pressure returned to baseline and stable Postop Assessment: no apparent nausea or vomiting Anesthetic complications: no    Last Vitals:  Vitals:   01/18/19 1307 01/18/19 1327  BP: 137/88 (!) 142/93  Pulse: 95 98  Resp: 12 16  Temp: (!) 36.3 C 36.4 C  SpO2: 96% 94%    Last Pain:  Vitals:   01/18/19 1327  TempSrc: Oral  PainSc:                  Alfa Leibensperger DAVID
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
## 2019-01-18 NOTE — Discharge Instructions (Signed)

## 2019-01-18 NOTE — Plan of Care (Signed)
  Problem: Clinical Measurements: Goal: Ability to maintain clinical measurements within normal limits will improve Outcome: Progressing   Problem: Coping: Goal: Level of anxiety will decrease Outcome: Progressing   Problem: Elimination: Goal: Will not experience complications related to bowel motility Outcome: Progressing   Problem: Pain Managment: Goal: General experience of comfort will improve Outcome: Progressing   Problem: Safety: Goal: Ability to remain free from injury will improve Outcome: Progressing   

## 2019-01-18 NOTE — H&P (Signed)
PREOPERATIVE H&P  Chief Complaint: left hip degenerative joint disease  HPI: Dylan [name] is a 58 y.o. male who presents for surgical treatment of left hip degenerative joint disease.  He denies any changes in medical history.  Past Medical History:  Diagnosis Date  . Anxiety   . Arthritis   . Depression   . History of kidney stones   . Neck pain   . Sleep apnea    to start CPAP after surgery   Past Surgical History:  Procedure Laterality Date  . HAND SURGERY Left    infected hand  . SHOULDER SURGERY Left    dislocated collor bone   Social History   Socioeconomic History  . Marital status: Married    Spouse name: Not on file  . Number of children: Not on file  . Years of education: Not on file  . Highest education level: Not on file  Occupational History  . Not on file  Social Needs  . Financial resource strain: Not on file  . Food insecurity    Worry: Not on file    Inability: Not on file  . Transportation needs    Medical: Not on file    Non-medical: Not on file  Tobacco Use  . Smoking status: Never Smoker  . Smokeless tobacco: Never Used  Substance and Sexual Activity  . Alcohol use: Not Currently  . Drug use: Never  . Sexual activity: Not on file  Lifestyle  . Physical activity    Days per week: Not on file    Minutes per session: Not on file  . Stress: Not on file  Relationships  . Social Herbalist on phone: Not on file    Gets together: Not on file    Attends religious service: Not on file    Active member of club or organization: Not on file    Attends meetings of clubs or organizations: Not on file    Relationship status: Not on file  Other Topics Concern  . Not on file  Social History Narrative  . Not on file   History reviewed. No pertinent family history. No Known Allergies Prior to Admission medications   Medication Sig Start Date End Date Taking? Authorizing Provider  acetaminophen (TYLENOL) 500 MG tablet Take  1,000 mg by mouth every 4 (four) hours as needed for moderate pain or headache.   Yes [provider]  amLODipine (NORVASC) 10 MG tablet Take 10 mg by mouth every evening.  11/26/18  Yes [provider]  aspirin EC 81 MG tablet Take 81 mg by mouth daily.   Yes [provider]  DULoxetine (CYMBALTA) 30 MG capsule Take 30 mg by mouth every evening.  12/28/18  Yes [provider]  gabapentin (NEURONTIN) 800 MG tablet Take 800 mg by mouth 4 (four) times daily.  12/07/18  Yes [provider]  lisinopril (ZESTRIL) 20 MG tablet Take 20 mg by mouth daily.  12/28/18  Yes [provider]  pravastatin (PRAVACHOL) 40 MG tablet Take 40 mg by mouth daily.  10/28/18  Yes [provider]  diclofenac sodium (VOLTAREN) 1 % GEL Apply 1 application topically 4 (four) times daily as needed (pain).    [provider]  sodium chloride (OCEAN) 0.65 % SOLN nasal spray Place 1 spray into both nostrils as needed for congestion.    [provider]     Positive ROS: All other systems have been reviewed and were  otherwise negative with the exception of those mentioned in the HPI and as above.  Physical Exam: General: Alert, no acute distress Cardiovascular: No pedal edema Respiratory: No cyanosis, no use of accessory musculature GI: abdomen soft Skin: No lesions in the area of chief complaint Neurologic: Sensation intact distally Psychiatric: Patient is competent for consent with normal mood and affect Lymphatic: no lymphedema  MUSCULOSKELETAL: exam stable  Assessment: left hip degenerative joint disease  Plan: Plan for Procedure(s): LEFT TOTAL HIP ARTHROPLASTY ANTERIOR APPROACH  The risks benefits and alternatives were discussed with the patient including but not limited to the risks of nonoperative treatment, versus surgical intervention including infection, bleeding, nerve injury,  blood clots, cardiopulmonary complications, morbidity,  mortality, among others, and they were willing to proceed.   Preoperative templating of the joint replacement has been completed, documented, and submitted to the Operating Room personnel in order to optimize intra-operative equipment management.  Glee ArvinMichael Xu, MD   01/18/2019 9:17 AM

## 2019-01-19 ENCOUNTER — Encounter (HOSPITAL_COMMUNITY): Payer: Self-pay | Admitting: General Practice

## 2019-01-19 DIAGNOSIS — M1612 Unilateral primary osteoarthritis, left hip: Secondary | ICD-10-CM | POA: Diagnosis not present

## 2019-01-19 LAB — CBC
HCT: 35.4 % — ABNORMAL LOW (ref 39.0–52.0)
Hemoglobin: 11.1 g/dL — ABNORMAL LOW (ref 13.0–17.0)
MCH: 27.7 pg (ref 26.0–34.0)
MCHC: 31.4 g/dL (ref 30.0–36.0)
MCV: 88.3 fL (ref 80.0–100.0)
Platelets: 205 10*3/uL (ref 150–400)
RBC: 4.01 MIL/uL — ABNORMAL LOW (ref 4.22–5.81)
RDW: 12.9 % (ref 11.5–15.5)
WBC: 16.3 10*3/uL — ABNORMAL HIGH (ref 4.0–10.5)
nRBC: 0 % (ref 0.0–0.2)

## 2019-01-19 LAB — BASIC METABOLIC PANEL
Anion gap: 8 (ref 5–15)
BUN: 24 mg/dL — ABNORMAL HIGH (ref 6–20)
CO2: 23 mmol/L (ref 22–32)
Calcium: 8.4 mg/dL — ABNORMAL LOW (ref 8.9–10.3)
Chloride: 103 mmol/L (ref 98–111)
Creatinine, Ser: 1.53 mg/dL — ABNORMAL HIGH (ref 0.61–1.24)
GFR calc Af Amer: 57 mL/min — ABNORMAL LOW (ref >60)
GFR calc non Af Amer: 49 mL/min — ABNORMAL LOW (ref >60)
Glucose, Bld: 156 mg/dL — ABNORMAL HIGH (ref 70–99)
Potassium: 5.3 mmol/L — ABNORMAL HIGH (ref 3.5–5.1)
Sodium: 134 mmol/L — ABNORMAL LOW (ref 135–145)

## 2019-01-19 NOTE — Plan of Care (Signed)

## 2019-01-19 NOTE — Progress Notes (Signed)
Physical Therapy Treatment Patient Details Name: Dylan [name] MRN: [id number] DOB: May 04, 1961 Today's Date: 01/19/2019    History of Present Illness Pt is a 58 y/o male s/p elective L THA, direct anterior approach. PMH includes sleep apnea.     PT Comments    Continuing work on functional mobility and activity tolerance;  Walked the hallways well again this afternoon with RW; Micah FlesherWent over therex program; Questions solicited and answered; OK for dc home from PT standpoint    Follow Up Recommendations  Follow surgeon's recommendation for DC plan and follow-up therapies;Supervision for mobility/OOB     Equipment Recommendations  Rolling walker with 5" wheels;3in1 (PT)    Recommendations for Other Services       Precautions / Restrictions Precautions Precautions: None Restrictions RLE Weight Bearing: Weight bearing as tolerated LLE Weight Bearing: Weight bearing as tolerated    Mobility  Bed Mobility Overal bed mobility: Needs Assistance Bed Mobility: Supine to Sit;Sit to Supine     Supine to sit: Supervision Sit to supine: Supervision   General bed mobility comments: Cues for technqiue; got up from flat bed to approximate home  Transfers Overall transfer level: Needs assistance Equipment used: Rolling walker (2 wheeled) Transfers: Sit to/from Stand Sit to Stand: Supervision         General transfer comment: Cues for hand placement  Ambulation/Gait Ambulation/Gait assistance: Supervision(one instance of minguard with 180 deg turn) Gait Distance (Feet): 200 Feet(greater than) Assistive device: Rolling walker (2 wheeled) Gait Pattern/deviations: Step-through pattern     General Gait Details: Cues to self-monitor for activity tolerance; noted small loss of balance with 180 degree turn; instructed pt to take turns slowly at home   Stairs             Wheelchair Mobility    Modified Rankin (Stroke Patients Only)       Balance                                            Cognition Arousal/Alertness: Awake/alert Behavior During Therapy: WFL for tasks assessed/performed Overall Cognitive Status: Within Functional Limits for tasks assessed                                        Exercises Total Joint Exercises Ankle Circles/Pumps: AROM;Both;15 reps Quad Sets: AROM;Both;10 reps Gluteal Sets: AROM;Both;10 reps Towel Squeeze: AROM;Both;10 reps Heel Slides: AROM;Left;10 reps Hip ABduction/ADduction: AROM;Left;10 reps Bridges: AROM;Both;10 reps    General Comments        Pertinent Vitals/Pain Pain Assessment: Faces Faces Pain Scale: Hurts a little bit Pain Location: L hip  Pain Descriptors / Indicators: Sore Pain Intervention(s): Monitored during session    Home Living                      Prior Function            PT Goals (current goals can now be found in the care plan section) Acute Rehab PT Goals Patient Stated Goal: to go home, back to working in the yard PT Goal Formulation: With patient Time For Goal Achievement: 02/01/19 Potential to Achieve Goals: Good Progress towards PT goals: Progressing toward goals    Frequency    7X/week      PT Plan Current plan remains  appropriate    Co-evaluation              AM-PAC PT "6 Clicks" Mobility   Outcome Measure  Help needed turning from your back to your side while in a flat bed without using bedrails?: None Help needed moving from lying on your back to sitting on the side of a flat bed without using bedrails?: None Help needed moving to and from a bed to a chair (including a wheelchair)?: None Help needed standing up from a chair using your arms (e.g., wheelchair or bedside chair)?: A Little Help needed to walk in hospital room?: A Little Help needed climbing 3-5 steps with a railing? : None 6 Click Score: 22    End of Session Equipment Utilized During Treatment: Gait belt Activity Tolerance: Patient  tolerated treatment well Patient left: with call bell/phone within reach;in bed Nurse Communication: Mobility status PT Visit Diagnosis: Other abnormalities of gait and mobility (R26.89);Pain Pain - Right/Left: Left Pain - part of body: Hip     Time: 1359-1420 PT Time Calculation (min) (ACUTE ONLY): 21 min  Charges:  $Gait Training: 8-22 mins                     Roney Marion, PT  Acute Rehabilitation Services Pager [phone] Office [place] 01/19/2019, 3:32 PM

## 2019-01-19 NOTE — Progress Notes (Signed)
Provided discharge education/instructions, all questions and concerns addressed, Pt not in distress, to discharge home with belongings upon delivery of DME to room.

## 2019-01-19 NOTE — TOC Transition Note (Signed)
Transition of Care [facility]) - CM/SW Discharge Note   Patient Details  Name: Elkin Belfield MRN: [id number] Date of Birth: [birth year]  [facility] CM/SW Contact:  Ninfa Meeker, RN Phone Number: [phone] (working remotely) 01/19/2019, 2:45 PM   Clinical Narrative:   58 yr old gentleman s/p Left total hip replacement. Case manager spoke with patient concerning discharge needs. Patient lives in [place], [place], permission received to locate a [facility] agency that accepts his insurance. Case manager called referral to [facility] in [place], [place], Faxed orders, demographics, H&P and op note to them at: [phone]. Patient will discharge home with family support.     Final next level of care: Home w Home Health Services Barriers to Discharge: No Barriers Identified   Patient Goals and CMS Choice Patient states their goals for this hospitalization and ongoing recovery are:: get better   Choice offered to / list presented to : Patient  Discharge Placement                       Discharge Plan and Services   Discharge Planning Services: CM Consult Post Acute Care Choice: Durable Medical Equipment, Home Health          DME Arranged: 3-N-1, Walker rolling DME Agency: [employer] Date DME Agency Contacted: 01/19/19 Time DME Agency Contacted: 1400 Representative spoke with at DME Agency: [place]: PT [place]: [place] Date [place]: 01/19/19 Time [place]: [place] Representative spoke with at [place]: [place] ([place]) Interventions     Readmission Risk Interventions No flowsheet data found.

## 2019-01-19 NOTE — Discharge Summary (Signed)
Patient ID: Dylan [name] MRN: [id number] DOB/AGE: Oct 12, 1960 58 y.o.  Admit date: 01/18/2019 Discharge date: 01/19/2019  Admission Diagnoses:  Principal Problem:   Primary osteoarthritis of left hip Active Problems:   Status post total replacement of left hip   Discharge Diagnoses:  Same  Past Medical History:  Diagnosis Date  . Anxiety   . Arthritis   . Depression   . History of kidney stones   . Neck pain   . Sleep apnea    to start CPAP after surgery    Surgeries: Procedure(s): LEFT TOTAL HIP ARTHROPLASTY ANTERIOR APPROACH on 01/18/2019   Consultants:   Discharged Condition: Improved  Hospital Course: Dylan [name] is an 58 y.o. male who was admitted 01/18/2019 for operative treatment ofPrimary osteoarthritis of left hip. Patient has severe unremitting pain that affects sleep, daily activities, and work/hobbies. After pre-op clearance the patient was taken to the operating room on 01/18/2019 and underwent  Procedure(s): LEFT TOTAL HIP ARTHROPLASTY ANTERIOR APPROACH.    Patient was given perioperative antibiotics:  Anti-infectives (From admission, onward)   Start     Dose/Rate Route Frequency Ordered Stop   01/18/19 1530  ceFAZolin (ANCEF) IVPB 2g/100 mL premix     2 g 200 mL/hr over 30 Minutes Intravenous Every 6 hours 01/18/19 1321 01/19/19 0603   01/18/19 1005  vancomycin (VANCOCIN) powder  Status:  Discontinued       As needed 01/18/19 1006 01/18/19 1159   01/18/19 0930  ceFAZolin (ANCEF) 3 g in dextrose 5 % 50 mL IVPB     3 g 100 mL/hr over 30 Minutes Intravenous To ShortStay Surgical 01/15/19 1118 01/18/19 0952       Patient was given sequential compression devices, early ambulation, and chemoprophylaxis to prevent DVT.  Patient benefited maximally from hospital stay and there were no complications.    Recent vital signs:  Patient Vitals for the past 24 hrs:  BP Temp Temp src Pulse Resp SpO2  01/19/19 0742 116/70 97.9 F (36.6 C) Oral 78 18 100 %   01/19/19 0354 111/74 97.8 F (36.6 C) Oral 80 14 96 %  01/19/19 0029 - 98.3 F (36.8 C) Oral - 16 98 %  01/18/19 2044 114/74 98.4 F (36.9 C) Oral 100 16 97 %  01/18/19 1816 123/76 - - (!) 102 - -  01/18/19 1522 104/62 98.2 F (36.8 C) Oral (!) 101 16 97 %  01/18/19 1327 (!) 142/93 97.6 F (36.4 C) Oral 98 16 94 %  01/18/19 1307 137/88 (!) 97.4 F (36.3 C) - 95 12 96 %  01/18/19 1305 (!) 140/98 - - 95 13 98 %  01/18/19 1300 - - - 92 15 96 %  01/18/19 1252 127/85 - - 95 16 97 %  01/18/19 1237 102/79 - - 92 14 98 %  01/18/19 1222 126/77 - - 94 16 97 %  01/18/19 1203 119/70 (!) 97.2 F (36.2 C) - 91 16 100 %  01/18/19 1202 - - - 90 16 100 %     Recent laboratory studies:  Recent Labs    01/19/19 0433  WBC 16.3*  HGB 11.1*  HCT 35.4*  PLT 205  NA 134*  K 5.3*  CL 103  CO2 23  BUN 24*  CREATININE 1.53*  GLUCOSE 156*  CALCIUM 8.4*     Discharge Medications:   Allergies as of 01/19/2019   No Known Allergies     Medication List    STOP taking these medications  acetaminophen 500 MG tablet Commonly known as: TYLENOL     TAKE these medications   amLODipine 10 MG tablet Commonly known as: NORVASC Take 10 mg by mouth every evening.   aspirin EC 81 MG tablet Take 1 tablet (81 mg total) by mouth 2 (two) times daily. What changed: when to take this   diclofenac sodium 1 % Gel Commonly known as: VOLTAREN Apply 1 application topically 4 (four) times daily as needed (pain).   DULoxetine 30 MG capsule Commonly known as: CYMBALTA Take 30 mg by mouth every evening.   gabapentin 800 MG tablet Commonly known as: NEURONTIN Take 800 mg by mouth 4 (four) times daily.   lisinopril 20 MG tablet Commonly known as: ZESTRIL Take 20 mg by mouth daily.   methocarbamol 750 MG tablet Commonly known as: ROBAXIN Take 1 tablet (750 mg total) by mouth 2 (two) times daily as needed for muscle spasms.   ondansetron 4 MG tablet Commonly known as: ZOFRAN Take 1-2 tablets  (4-8 mg total) by mouth every 8 (eight) hours as needed for nausea or vomiting.   oxyCODONE 5 MG immediate release tablet Commonly known as: Oxy IR/ROXICODONE Take 1-3 tablets (5-15 mg total) by mouth every 6 (six) hours as needed.   oxyCODONE 10 mg 12 hr tablet Commonly known as: OXYCONTIN Take 1 tablet (10 mg total) by mouth every 12 (twelve) hours for 3 days.   pravastatin 40 MG tablet Commonly known as: PRAVACHOL Take 40 mg by mouth daily.   promethazine 25 MG tablet Commonly known as: PHENERGAN Take 1 tablet (25 mg total) by mouth every 6 (six) hours as needed for nausea.   senna-docusate 8.6-50 MG tablet Commonly known as: Senokot S Take 1-2 tablets by mouth at bedtime as needed.   sodium chloride 0.65 % Soln nasal spray Commonly known as: OCEAN Place 1 spray into both nostrils as needed for congestion.            Durable Medical Equipment  (From admission, onward)         Start     Ordered   01/18/19 1322  DME Walker rolling  Once    Question:  Patient needs a walker to treat with the following condition  Answer:  History of hip replacement   01/18/19 1321   01/18/19 1322  DME 3 n 1  Once     01/18/19 1321   01/18/19 1322  DME Bedside commode  Once    Question:  Patient needs a bedside commode to treat with the following condition  Answer:  History of hip replacement   01/18/19 1321          Diagnostic Studies: Dg Chest 2 View  Result Date: 01/14/2019 CLINICAL DATA:  Primary localized osteoarthritis of the LEFT hip EXAM: CHEST - 2 VIEW COMPARISON:  None FINDINGS: Normal heart size, mediastinal contours, and pulmonary vascularity. Minimal linear subsegmental atelectasis or scarring at lingula. Lungs otherwise clear. No infiltrate, pleural effusion or pneumothorax. Bones unremarkable. IMPRESSION: Minimal subsegmental atelectasis or scarring at lingula. Otherwise negative exam. Electronically Signed   By: Ulyses SouthwardMark  Boles M.D.   On: 01/14/2019 15:38   Dg Pelvis  Portable  Result Date: 01/18/2019 CLINICAL DATA:  Left hip joint replacement. EXAM: PORTABLE PELVIS 1-2 VIEWS COMPARISON:  01/18/2019. FINDINGS: Total left hip replacement. Hardware intact. Anatomic alignment. Degenerative change lumbar spine and right hip. IMPRESSION: Total left hip replacement with anatomic alignment. No acute bony abnormality. Electronically Signed   By: Maisie Fushomas  Register  On: 01/18/2019 12:31   Dg C-arm 1-60 Min  Result Date: 01/18/2019 CLINICAL DATA:  Left total hip arthroplasty. EXAM: DG C-ARM 61-120 MIN; OPERATIVE LEFT HIP WITH PELVIS COMPARISON:  Left hip radiographs 01/05/2019. FINDINGS: Seven spot fluoroscopic images demonstrate the sequential performance of a left total hip arthroplasty. On the final images, the femoral and screw fixed acetabular components appear well positioned. No demonstrated complications. IMPRESSION: Intraoperative views during left total hip arthroplasty. No demonstrated complications. Electronically Signed   By: Richardean Sale M.D.   On: 01/18/2019 11:38   Dg Hip Operative Unilat W Or W/o Pelvis Left  Result Date: 01/18/2019 CLINICAL DATA:  Left total hip arthroplasty. EXAM: DG C-ARM 61-120 MIN; OPERATIVE LEFT HIP WITH PELVIS COMPARISON:  Left hip radiographs 01/05/2019. FINDINGS: Seven spot fluoroscopic images demonstrate the sequential performance of a left total hip arthroplasty. On the final images, the femoral and screw fixed acetabular components appear well positioned. No demonstrated complications. IMPRESSION: Intraoperative views during left total hip arthroplasty. No demonstrated complications. Electronically Signed   By: Richardean Sale M.D.   On: 01/18/2019 11:38   Xr Hip Unilat W Or W/o Pelvis 2-3 Views Left  Result Date: 01/05/2019 Severe bone-on-bone joint space narrowing of the left hip joint.  Moderately severe right hip degenerative joint disease.  Xr Lumbar Spine 2-3 Views  Result Date: 01/05/2019 Preservation of lumbar  lordosis.  Moderate lumbar spondylosis.  Mild degenerative disc disease with some spurring of the vertebral bodies.   Disposition: Discharge disposition: 01-Home or Self Care         Follow-up Information    Leandrew Koyanagi, MD In 2 weeks.   Specialty: Orthopedic Surgery Why: For suture removal, For wound re-check Contact information: [place] [address] [phone]            Signed: Aundra Dubin 01/19/2019, 7:57 AM

## 2019-01-19 NOTE — Care Management Obs Status (Signed)
[place] NOTIFICATION   Patient Details  Name: Dylan [name] MRN: [id number] Date of Birth: 13-Oct-1960   Medicare Observation Status Notification Given:  Yes    Ninfa Meeker, RN 01/19/2019, 2:36 PM

## 2019-01-19 NOTE — Progress Notes (Signed)
Physical Therapy Treatment Patient Details Name: Dylan [name] MRN: [id number] DOB: 05/01/1961 Today's Date: 01/19/2019    History of Present Illness Pt is a 58 y/o male s/p elective L THA, direct anterior approach. PMH includes sleep apnea.     PT Comments    Continuing work on functional mobility and activity tolerance;  Excellent progress towards goals; Anticipate dc home today; will take second PT session to focus on therex and answer questions re: dc   Follow Up Recommendations  Follow surgeon's recommendation for DC plan and follow-up therapies;Supervision for mobility/OOB     Equipment Recommendations  Rolling walker with 5" wheels;3in1 (PT)    Recommendations for Other Services       Precautions / Restrictions Precautions Precautions: None Restrictions Weight Bearing Restrictions: Yes RLE Weight Bearing: Weight bearing as tolerated LLE Weight Bearing: Weight bearing as tolerated    Mobility  Bed Mobility Overal bed mobility: Needs Assistance Bed Mobility: Supine to Sit     Supine to sit: Supervision     General bed mobility comments: Cues for technqiue; got up from flat bed to approximate home  Transfers Overall transfer level: Needs assistance Equipment used: Rolling walker (2 wheeled) Transfers: Sit to/from Stand Sit to Stand: Min guard         General transfer comment: Min guard for safety. Cues for safe hand placement.   Ambulation/Gait Ambulation/Gait assistance: Min guard(without physical contact) Gait Distance (Feet): 200 Feet(greater than) Assistive device: Rolling walker (2 wheeled) Gait Pattern/deviations: Step-through pattern(emerging)     General Gait Details: Much smoother gait pattern; adjusted height of RW for better fit   Stairs Stairs: Yes Stairs assistance: Min guard Stair Management: One rail Left;Sideways;Step to pattern Number of Stairs: 5 General stair comments: Verbal and deom cues for sequence and technqiue; overall  no difficulty   Wheelchair Mobility    Modified Rankin (Stroke Patients Only)       Balance     Sitting balance-Leahy Scale: Good       Standing balance-Leahy Scale: Fair                              Cognition Arousal/Alertness: Awake/alert Behavior During Therapy: WFL for tasks assessed/performed Overall Cognitive Status: Within Functional Limits for tasks assessed                                        Exercises      General Comments        Pertinent Vitals/Pain Pain Assessment: 0-10 Pain Score: 4  Pain Location: L hip  Pain Descriptors / Indicators: Sore Pain Intervention(s): Monitored during session    Home Living                      Prior Function            PT Goals (current goals can now be found in the care plan section) Acute Rehab PT Goals Patient Stated Goal: to go home, back to working in the yard PT Goal Formulation: With patient Time For Goal Achievement: 02/01/19 Potential to Achieve Goals: Good Progress towards PT goals: Progressing toward goals    Frequency    7X/week      PT Plan Current plan remains appropriate    Co-evaluation  AM-PAC PT "6 Clicks" Mobility   Outcome Measure  Help needed turning from your back to your side while in a flat bed without using bedrails?: None Help needed moving from lying on your back to sitting on the side of a flat bed without using bedrails?: None Help needed moving to and from a bed to a chair (including a wheelchair)?: None Help needed standing up from a chair using your arms (e.g., wheelchair or bedside chair)?: A Little Help needed to walk in hospital room?: A Little Help needed climbing 3-5 steps with a railing? : A Little 6 Click Score: 21    End of Session Equipment Utilized During Treatment: Gait belt Activity Tolerance: Patient tolerated treatment well Patient left: in chair;with call bell/phone within reach Nurse  Communication: Mobility status PT Visit Diagnosis: Other abnormalities of gait and mobility (R26.89);Pain Pain - Right/Left: Left Pain - part of body: Hip     Time: 0831-0900 PT Time Calculation (min) (ACUTE ONLY): 29 min  Charges:  $Gait Training: 23-37 mins                     Van ClinesHolly Makyiah Lie, [place]PT  Acute Rehabilitation Services Pager [phone] Office [phone]    Levi AlandHolly H Audiel Scheiber 01/19/2019, 9:57 AM

## 2019-01-19 NOTE — Progress Notes (Signed)
Subjective: 1 Day Post-Op Procedure(s) (LRB): LEFT TOTAL HIP ARTHROPLASTY ANTERIOR APPROACH (Left) Patient reports pain as mild.  Doing well this am.   Objective: Vital signs in last 24 hours: Temp:  [97.2 F (36.2 C)-98.4 F (36.9 C)] 97.9 F (36.6 C) (06/23 0742) Pulse Rate:  [78-102] 78 (06/23 0742) Resp:  [12-18] 18 (06/23 0742) BP: (102-142)/(62-98) 116/70 (06/23 0742) SpO2:  [94 %-100 %] 100 % (06/23 0742)  Intake/Output from previous day: 06/22 0701 - 06/23 0700 In: 2725 [P.O.:240; I.V.:2085; IV Piggyback:200] Out: 2300 [Urine:2000; Blood:300] Intake/Output this shift: No intake/output data recorded.  Recent Labs    01/19/19 0433  HGB 11.1*   Recent Labs    01/19/19 0433  WBC 16.3*  RBC 4.01*  HCT 35.4*  PLT 205   Recent Labs    01/19/19 0433  NA 134*  K 5.3*  CL 103  CO2 23  BUN 24*  CREATININE 1.53*  GLUCOSE 156*  CALCIUM 8.4*   No results for input(s): LABPT, INR in the last 72 hours.  Neurologically intact Neurovascular intact Sensation intact distally Intact pulses distally Dorsiflexion/Plantar flexion intact Incision: dressing C/D/I No cellulitis present Compartment soft   Assessment/Plan: 1 Day Post-Op Procedure(s) (LRB): LEFT TOTAL HIP ARTHROPLASTY ANTERIOR APPROACH (Left) Advance diet Up with therapy D/C IV fluids Discharge home with home health after second PT session WBAT LLE- no precautions ABLA- mild and stable F/u with Dr. Erlinda Hong 2 weeks post-op      Dylan [name] 01/19/2019, 7:55 AM

## 2019-01-21 ENCOUNTER — Encounter (HOSPITAL_COMMUNITY): Payer: Self-pay | Admitting: Orthopaedic Surgery

## 2019-01-27 ENCOUNTER — Telehealth: Payer: Self-pay | Admitting: Orthopaedic Surgery

## 2019-01-27 ENCOUNTER — Telehealth: Payer: Self-pay | Admitting: Radiology

## 2019-01-27 NOTE — Telephone Encounter (Signed)
Notified. 

## 2019-01-27 NOTE — Telephone Encounter (Signed)
Patient had left total hip replacement 01/18/19 Called complains of dizziness, light headedness, has almost stumbled and fell. No hip pain, some left knee pain, some tingling to top of left foot No nausea, no fever, eats but not large meals at once Is taking oxycodone and robaxin, states symptoms are worse after taking medication  Please advise.  Call back # [phone]

## 2019-01-27 NOTE — Telephone Encounter (Signed)
DISREGARD-SENT TO TRIAGE

## 2019-01-27 NOTE — Telephone Encounter (Signed)
Yeah can definitely be from medication.  May want to try cutting the dose in half to see if this helps.

## 2019-02-02 ENCOUNTER — Encounter: Payer: Self-pay | Admitting: Orthopaedic Surgery

## 2019-02-02 ENCOUNTER — Ambulatory Visit (INDEPENDENT_AMBULATORY_CARE_PROVIDER_SITE_OTHER): Payer: Medicare PPO | Admitting: Physician Assistant

## 2019-02-02 ENCOUNTER — Other Ambulatory Visit: Payer: Self-pay

## 2019-02-02 DIAGNOSIS — Z96642 Presence of left artificial hip joint: Secondary | ICD-10-CM

## 2019-02-02 NOTE — Progress Notes (Signed)
   Post-Op Visit Note   Patient: Dylan [name]           Date of Birth: May 27, 1961           MRN: [id number] Visit Date: 02/02/2019 PCP: Frances Maywood, FNP   Assessment & Plan:  Chief Complaint:  Chief Complaint  Patient presents with  . Left Hip - Routine Post Op   Visit Diagnoses:  1. Status post total hip replacement, left     Plan: Patient is a pleasant 58 year old gentleman who presents our clinic today 15 days status post left anterior total hip replacement, date of surgery 01/18/2019.  He has been doing well.  He has been getting home health physical therapy.  He has minimal pain which is relieved with Tylenol and Advil.  Overall, doing well.  He is ambulating at times with a cane, but other wise ambulates without assistance.  Examination of his left hip reveals a well-healing surgical incision without evidence of infection or cellulitis.  He does have a small seroma that is nontender.  Calf is soft nontender.  He is neurovascular intact distally.  At this point, he will finish out his home health physical therapy.  I do not believe he needs formal physical therapy.  He will alternate ice and heat to the seroma.  Dental prophylaxis was reinforced.  He does note that he has upcoming cervical spine surgery and we have advised him to wait 6 to 8 weeks postop from his hip replacement before proceeding with this.  He does not need a work note since he is on disability.  He will follow-up with [place] in 4 weeks time for repeat evaluation and x-rays.  He will call with concerns or questions in the meantime.  Follow-Up Instructions: Return in about 4 weeks (around 03/02/2019).   Orders:  No orders of the defined types were placed in this encounter.  No orders of the defined types were placed in this encounter.   Imaging: No new imaging  PMFS History: Patient Active Problem List   Diagnosis Date Noted  . Primary osteoarthritis of left hip 01/18/2019  . Status post total replacement of  left hip 01/18/2019   Past Medical History:  Diagnosis Date  . Anxiety   . Arthritis   . Depression   . History of kidney stones   . Hypertension   . Neck pain   . Sleep apnea    to start CPAP after surgery    History reviewed. No pertinent family history.  Past Surgical History:  Procedure Laterality Date  . HAND SURGERY Left    infected hand  . SHOULDER SURGERY Left    dislocated collor bone  . TOTAL HIP ARTHROPLASTY Left 01/18/2019  . TOTAL HIP ARTHROPLASTY Left 01/18/2019   Procedure: LEFT TOTAL HIP ARTHROPLASTY ANTERIOR APPROACH;  Surgeon: Leandrew Koyanagi, MD;  Location: [facility];  Service: Orthopedics;  Laterality: Left;   Social History   Occupational History  . Not on file  Tobacco Use  . Smoking status: Never Smoker  . Smokeless tobacco: Never Used  Substance and Sexual Activity  . Alcohol use: Not Currently  . Drug use: Never  . Sexual activity: Not on file

## 2019-02-11 ENCOUNTER — Telehealth: Payer: Self-pay | Admitting: Orthopaedic Surgery

## 2019-02-11 NOTE — Telephone Encounter (Signed)
FYI

## 2019-02-11 NOTE — Telephone Encounter (Signed)
Received call from [facility] with [place] advised patient is being discharged. Ronalee Belts said patient is doing great. The number to contact Ronalee Belts is [phone]

## 2019-02-12 NOTE — Telephone Encounter (Signed)
Ok, thanks.

## 2019-03-05 ENCOUNTER — Ambulatory Visit (INDEPENDENT_AMBULATORY_CARE_PROVIDER_SITE_OTHER): Payer: Medicare PPO

## 2019-03-05 ENCOUNTER — Encounter: Payer: Self-pay | Admitting: Physician Assistant

## 2019-03-05 ENCOUNTER — Other Ambulatory Visit: Payer: Self-pay

## 2019-03-05 ENCOUNTER — Ambulatory Visit (INDEPENDENT_AMBULATORY_CARE_PROVIDER_SITE_OTHER): Payer: Medicare PPO | Admitting: Physician Assistant

## 2019-03-05 VITALS — Ht 72.0 in | Wt 255.0 lb

## 2019-03-05 DIAGNOSIS — Z96642 Presence of left artificial hip joint: Secondary | ICD-10-CM | POA: Diagnosis not present

## 2019-03-05 NOTE — Progress Notes (Signed)
   Post-Op Visit Note   Patient: Dylan [name]           Date of Birth: 1960/12/24           MRN: [id number] Visit Date: 03/05/2019 PCP: Frances Maywood, FNP   Assessment & Plan:  Chief Complaint:  Chief Complaint  Patient presents with  . Left Hip - Follow-up    01/18/2019 Left THA-Direct Anterior   Visit Diagnoses:  1. Status post total hip replacement, left     Plan: Patient is a pleasant 58 year old gentleman who presents our clinic today 46 days status post left anterior total hip replacement, date of surgery 01/18/2019.  He has been doing excellent.  He denies any pain.  He is doing great overall.  Examination of his left hip reveals a fully healed scar.  Full range of motion and strength throughout.  He is neurovascular intact distally.  Calves are soft nontender.  At this point, he will continue with activities as tolerated.  He will follow-up with [place] in 6 weeks time for recheck.  Dental prophylaxis reinforced.  Call with concerns or questions in meantime.  Follow-Up Instructions: Return in about 6 weeks (around 04/16/2019).   Orders:  Orders Placed This Encounter  Procedures  . XR HIP UNILAT W OR W/O PELVIS 2-3 VIEWS LEFT   No orders of the defined types were placed in this encounter.   Imaging: Xr Hip Unilat W Or W/o Pelvis 2-3 Views Left  Result Date: 03/05/2019 Well-seated prosthesis without complication   PMFS History: Patient Active Problem List   Diagnosis Date Noted  . Primary osteoarthritis of left hip 01/18/2019  . Status post total replacement of left hip 01/18/2019   Past Medical History:  Diagnosis Date  . Anxiety   . Arthritis   . Depression   . History of kidney stones   . Hypertension   . Neck pain   . Sleep apnea    to start CPAP after surgery    History reviewed. No pertinent family history.  Past Surgical History:  Procedure Laterality Date  . HAND SURGERY Left    infected hand  . SHOULDER SURGERY Left    dislocated collor bone   . TOTAL HIP ARTHROPLASTY Left 01/18/2019  . TOTAL HIP ARTHROPLASTY Left 01/18/2019   Procedure: LEFT TOTAL HIP ARTHROPLASTY ANTERIOR APPROACH;  Surgeon: Leandrew Koyanagi, MD;  Location: [facility];  Service: Orthopedics;  Laterality: Left;   Social History   Occupational History  . Not on file  Tobacco Use  . Smoking status: Never Smoker  . Smokeless tobacco: Never Used  Substance and Sexual Activity  . Alcohol use: Not Currently  . Drug use: Never  . Sexual activity: Not on file

## 2019-03-10 ENCOUNTER — Telehealth: Payer: Self-pay | Admitting: Radiology

## 2019-03-10 NOTE — Telephone Encounter (Signed)
no

## 2019-03-10 NOTE — Telephone Encounter (Signed)
Patient called with increasing left hip and leg pain after weedeating and doing some things in the yard.  He states that he was not having pain at all and is now having pain in his groin, lateral thigh, and his knee. He also describes some back pain. He is able to bear weight, but states that it is painful.   Patient made work in appt for tomorrow morning at [place]. Is there anything that you would recommend or like for patient to do prior to appt?  CB for patient is [phone]

## 2019-03-11 ENCOUNTER — Inpatient Hospital Stay (HOSPITAL_COMMUNITY): Payer: Medicare PPO

## 2019-03-11 ENCOUNTER — Inpatient Hospital Stay (HOSPITAL_COMMUNITY): Payer: Medicare PPO | Admitting: Anesthesiology

## 2019-03-11 ENCOUNTER — Encounter: Payer: Self-pay | Admitting: Orthopaedic Surgery

## 2019-03-11 ENCOUNTER — Encounter (HOSPITAL_COMMUNITY)
Admission: RE | Disposition: A | Discharge status: Home Health | Payer: Self-pay | Source: Home / Self Care | Attending: Orthopaedic Surgery

## 2019-03-11 ENCOUNTER — Other Ambulatory Visit (HOSPITAL_COMMUNITY)
Admission: RE | Admit: 2019-03-11 | Discharge: 2019-03-11 | Disposition: A | Discharge status: Home / Self Care | Payer: Medicare PPO | Source: Ambulatory Visit | Attending: Orthopaedic Surgery | Admitting: Orthopaedic Surgery

## 2019-03-11 ENCOUNTER — Encounter (HOSPITAL_COMMUNITY): Payer: Self-pay

## 2019-03-11 ENCOUNTER — Ambulatory Visit (INDEPENDENT_AMBULATORY_CARE_PROVIDER_SITE_OTHER): Payer: Medicare PPO | Admitting: Orthopaedic Surgery

## 2019-03-11 ENCOUNTER — Other Ambulatory Visit: Payer: Self-pay

## 2019-03-11 ENCOUNTER — Inpatient Hospital Stay (HOSPITAL_COMMUNITY)
Admission: RE | Admit: 2019-03-11 | Discharge: 2019-03-15 | DRG: 467 | Disposition: A | Discharge status: Home Health | Payer: Medicare PPO | Attending: Orthopaedic Surgery | Admitting: Orthopaedic Surgery

## 2019-03-11 VITALS — BP 105/75 | HR 89 | Temp 100.3°F

## 2019-03-11 DIAGNOSIS — I10 Essential (primary) hypertension: Secondary | ICD-10-CM | POA: Diagnosis present

## 2019-03-11 DIAGNOSIS — T8452XA Infection and inflammatory reaction due to internal left hip prosthesis, initial encounter: Secondary | ICD-10-CM | POA: Diagnosis present

## 2019-03-11 DIAGNOSIS — Z20828 Contact with and (suspected) exposure to other viral communicable diseases: Secondary | ICD-10-CM | POA: Insufficient documentation

## 2019-03-11 DIAGNOSIS — Z8619 Personal history of other infectious and parasitic diseases: Secondary | ICD-10-CM | POA: Diagnosis not present

## 2019-03-11 DIAGNOSIS — D62 Acute posthemorrhagic anemia: Secondary | ICD-10-CM | POA: Diagnosis not present

## 2019-03-11 DIAGNOSIS — Z79899 Other long term (current) drug therapy: Secondary | ICD-10-CM | POA: Diagnosis not present

## 2019-03-11 DIAGNOSIS — Z419 Encounter for procedure for purposes other than remedying health state, unspecified: Secondary | ICD-10-CM

## 2019-03-11 DIAGNOSIS — B9561 Methicillin susceptible Staphylococcus aureus infection as the cause of diseases classified elsewhere: Secondary | ICD-10-CM | POA: Diagnosis present

## 2019-03-11 DIAGNOSIS — Z96649 Presence of unspecified artificial hip joint: Secondary | ICD-10-CM

## 2019-03-11 DIAGNOSIS — Z01812 Encounter for preprocedural laboratory examination: Secondary | ICD-10-CM | POA: Insufficient documentation

## 2019-03-11 DIAGNOSIS — Z7982 Long term (current) use of aspirin: Secondary | ICD-10-CM | POA: Diagnosis not present

## 2019-03-11 DIAGNOSIS — Y831 Surgical operation with implant of artificial internal device as the cause of abnormal reaction of the patient, or of later complication, without mention of misadventure at the time of the procedure: Secondary | ICD-10-CM | POA: Diagnosis present

## 2019-03-11 DIAGNOSIS — Z6833 Body mass index (BMI) 33.0-33.9, adult: Secondary | ICD-10-CM | POA: Diagnosis not present

## 2019-03-11 DIAGNOSIS — Z96642 Presence of left artificial hip joint: Secondary | ICD-10-CM

## 2019-03-11 DIAGNOSIS — E669 Obesity, unspecified: Secondary | ICD-10-CM | POA: Diagnosis present

## 2019-03-11 DIAGNOSIS — N179 Acute kidney failure, unspecified: Secondary | ICD-10-CM | POA: Diagnosis present

## 2019-03-11 DIAGNOSIS — M00052 Staphylococcal arthritis, left hip: Secondary | ICD-10-CM | POA: Diagnosis not present

## 2019-03-11 HISTORY — PX: ANTERIOR HIP REVISION: SHX6527

## 2019-03-11 LAB — SEDIMENTATION RATE: Sed Rate: 39 mm/hr — ABNORMAL HIGH (ref 0–16)

## 2019-03-11 LAB — CBC
HCT: 34.9 % — ABNORMAL LOW (ref 39.0–52.0)
Hemoglobin: 10.9 g/dL — ABNORMAL LOW (ref 13.0–17.0)
MCH: 27.3 pg (ref 26.0–34.0)
MCHC: 31.2 g/dL (ref 30.0–36.0)
MCV: 87.5 fL (ref 80.0–100.0)
Platelets: 184 10*3/uL (ref 150–400)
RBC: 3.99 MIL/uL — ABNORMAL LOW (ref 4.22–5.81)
RDW: 13 % (ref 11.5–15.5)
WBC: 16.3 10*3/uL — ABNORMAL HIGH (ref 4.0–10.5)
nRBC: 0 % (ref 0.0–0.2)

## 2019-03-11 LAB — SARS CORONAVIRUS 2 BY RT PCR (HOSPITAL ORDER, PERFORMED IN ~~LOC~~ HOSPITAL LAB): SARS Coronavirus 2: NEGATIVE

## 2019-03-11 LAB — BASIC METABOLIC PANEL
Anion gap: 9 (ref 5–15)
BUN: 20 mg/dL (ref 6–20)
CO2: 22 mmol/L (ref 22–32)
Calcium: 8.7 mg/dL — ABNORMAL LOW (ref 8.9–10.3)
Chloride: 106 mmol/L (ref 98–111)
Creatinine, Ser: 1.42 mg/dL — ABNORMAL HIGH (ref 0.61–1.24)
GFR calc Af Amer: >60 mL/min (ref >60)
GFR calc non Af Amer: 54 mL/min — ABNORMAL LOW (ref >60)
Glucose, Bld: 120 mg/dL — ABNORMAL HIGH (ref 70–99)
Potassium: 3.9 mmol/L (ref 3.5–5.1)
Sodium: 137 mmol/L (ref 135–145)

## 2019-03-11 LAB — C-REACTIVE PROTEIN: CRP: 28.1 mg/dL — ABNORMAL HIGH (ref <1.0)

## 2019-03-11 LAB — SURGICAL PCR SCREEN
MRSA, PCR: NEGATIVE
Staphylococcus aureus: POSITIVE — AB

## 2019-03-11 SURGERY — REVISION, TOTAL ARTHROPLASTY, HIP, ANTERIOR APPROACH
Anesthesia: General | Laterality: Left

## 2019-03-11 MED ORDER — EPHEDRINE SULFATE 50 MG/ML IJ SOLN
INTRAMUSCULAR | Status: DC | PRN
  Administered 2019-03-11: 10 mg via INTRAVENOUS

## 2019-03-11 MED ORDER — MENTHOL 3 MG MT LOZG: 1.0000 | LOZENGE | OROMUCOSAL | Status: DC | PRN

## 2019-03-11 MED ORDER — TRANEXAMIC ACID-NACL 1000-0.7 MG/100ML-% IV SOLN
1000.0000 mg | INTRAVENOUS | Status: AC
  Administered 2019-03-11: 23:00:00 1000 mg via INTRAVENOUS
  Filled 2019-03-11 (×2): qty 100

## 2019-03-11 MED ORDER — TRANEXAMIC ACID 1000 MG/10ML IV SOLN
2000.0000 mg | INTRAVENOUS | Status: DC
  Filled 2019-03-11: qty 20

## 2019-03-11 MED ORDER — SORBITOL 70 % SOLN: 30.0000 mL | Freq: Every day | Status: DC | PRN

## 2019-03-11 MED ORDER — HYDROCODONE-ACETAMINOPHEN 5-325 MG PO TABS
1.0000 | ORAL_TABLET | ORAL | Status: DC | PRN
  Administered 2019-03-11: 15:00:00 2 via ORAL
  Filled 2019-03-11: qty 2

## 2019-03-11 MED ORDER — CHLORHEXIDINE GLUCONATE CLOTH 2 % EX PADS
6.0000 | MEDICATED_PAD | Freq: Every day | CUTANEOUS | Status: AC
  Administered 2019-03-11 – 2019-03-15 (×5): 6 via TOPICAL

## 2019-03-11 MED ORDER — METOCLOPRAMIDE HCL 5 MG/ML IJ SOLN: 5.0000 mg | Freq: Three times a day (TID) | INTRAMUSCULAR | Status: DC | PRN

## 2019-03-11 MED ORDER — AMLODIPINE BESYLATE 10 MG PO TABS
10.0000 mg | ORAL_TABLET | Freq: Every evening | ORAL | Status: DC
  Administered 2019-03-13 – 2019-03-14 (×2): 10 mg via ORAL
  Filled 2019-03-11 (×2): qty 1

## 2019-03-11 MED ORDER — DIPHENHYDRAMINE HCL 12.5 MG/5ML PO ELIX: 12.5000 mg | ORAL_SOLUTION | ORAL | Status: DC | PRN

## 2019-03-11 MED ORDER — PROPOFOL 10 MG/ML IV BOLUS
INTRAVENOUS | Status: DC | PRN
  Administered 2019-03-11: 150 mg via INTRAVENOUS

## 2019-03-11 MED ORDER — VANCOMYCIN HCL 1000 MG IV SOLR
INTRAVENOUS | Status: AC
  Filled 2019-03-11: qty 1000

## 2019-03-11 MED ORDER — DOCUSATE SODIUM 100 MG PO CAPS: 100.0000 mg | ORAL_CAPSULE | Freq: Two times a day (BID) | ORAL | Status: DC

## 2019-03-11 MED ORDER — OXYCODONE HCL 5 MG PO TABS
5.0000 mg | ORAL_TABLET | ORAL | Status: DC | PRN
  Administered 2019-03-14: 10:00:00 10 mg via ORAL
  Filled 2019-03-11 (×2): qty 2

## 2019-03-11 MED ORDER — HYDROMORPHONE HCL 1 MG/ML IJ SOLN
0.2500 mg | INTRAMUSCULAR | Status: DC | PRN
  Administered 2019-03-11 (×2): 0.5 mg via INTRAVENOUS

## 2019-03-11 MED ORDER — ONDANSETRON HCL 4 MG PO TABS: 4.0000 mg | ORAL_TABLET | Freq: Four times a day (QID) | ORAL | Status: DC | PRN

## 2019-03-11 MED ORDER — METHOCARBAMOL 500 MG PO TABS: 500.0000 mg | ORAL_TABLET | Freq: Four times a day (QID) | ORAL | Status: DC | PRN

## 2019-03-11 MED ORDER — PHENOL 1.4 % MT LIQD: 1.0000 | OROMUCOSAL | Status: DC | PRN

## 2019-03-11 MED ORDER — DOCUSATE SODIUM 100 MG PO CAPS
100.0000 mg | ORAL_CAPSULE | Freq: Two times a day (BID) | ORAL | Status: DC
  Administered 2019-03-12 – 2019-03-15 (×8): 100 mg via ORAL
  Filled 2019-03-11 (×8): qty 1

## 2019-03-11 MED ORDER — DIPHENHYDRAMINE HCL 12.5 MG/5ML PO ELIX: 25.0000 mg | ORAL_SOLUTION | ORAL | Status: DC | PRN

## 2019-03-11 MED ORDER — HYDROCODONE-ACETAMINOPHEN 7.5-325 MG PO TABS
1.0000 | ORAL_TABLET | ORAL | Status: DC | PRN
  Administered 2019-03-15 (×2): 1 via ORAL
  Filled 2019-03-11 (×2): qty 1

## 2019-03-11 MED ORDER — SODIUM CHLORIDE 0.9 % IV SOLN: INTRAVENOUS | Status: DC

## 2019-03-11 MED ORDER — METHOCARBAMOL 1000 MG/10ML IJ SOLN: 500.0000 mg | Freq: Four times a day (QID) | INTRAVENOUS | Status: DC | PRN

## 2019-03-11 MED ORDER — PHENYLEPHRINE HCL (PRESSORS) 10 MG/ML IV SOLN
INTRAVENOUS | Status: DC | PRN
  Administered 2019-03-11 (×2): 80 ug via INTRAVENOUS
  Administered 2019-03-11: 120 ug via INTRAVENOUS
  Administered 2019-03-11 (×3): 80 ug via INTRAVENOUS
  Administered 2019-03-11: 120 ug via INTRAVENOUS

## 2019-03-11 MED ORDER — GABAPENTIN 400 MG PO CAPS
800.0000 mg | ORAL_CAPSULE | Freq: Four times a day (QID) | ORAL | Status: DC
  Administered 2019-03-11 – 2019-03-15 (×14): 800 mg via ORAL
  Filled 2019-03-11 (×15): qty 2

## 2019-03-11 MED ORDER — DEXAMETHASONE SODIUM PHOSPHATE 10 MG/ML IJ SOLN
10.0000 mg | Freq: Once | INTRAMUSCULAR | Status: AC
  Administered 2019-03-12: 09:00:00 10 mg via INTRAVENOUS
  Filled 2019-03-11: qty 1

## 2019-03-11 MED ORDER — GABAPENTIN 800 MG PO TABS
800.0000 mg | ORAL_TABLET | Freq: Four times a day (QID) | ORAL | Status: DC
  Filled 2019-03-11 (×3): qty 1

## 2019-03-11 MED ORDER — ALBUMIN HUMAN 5 % IV SOLN
12.5000 g | Freq: Once | INTRAVENOUS | Status: AC
  Administered 2019-03-11: 23:00:00 12.5 g via INTRAVENOUS

## 2019-03-11 MED ORDER — VANCOMYCIN HCL IN DEXTROSE 500-5 MG/100ML-% IV SOLN
500.0000 mg | INTRAVENOUS | Status: DC
  Filled 2019-03-11: qty 100

## 2019-03-11 MED ORDER — DEXAMETHASONE SODIUM PHOSPHATE 10 MG/ML IJ SOLN
INTRAMUSCULAR | Status: DC | PRN
  Administered 2019-03-11: 10 mg via INTRAVENOUS

## 2019-03-11 MED ORDER — SUGAMMADEX SODIUM 200 MG/2ML IV SOLN
INTRAVENOUS | Status: DC | PRN
  Administered 2019-03-11: 250 mg via INTRAVENOUS

## 2019-03-11 MED ORDER — ONDANSETRON HCL 4 MG/2ML IJ SOLN
4.0000 mg | Freq: Four times a day (QID) | INTRAMUSCULAR | Status: DC | PRN
  Administered 2019-03-12 – 2019-03-13 (×2): 4 mg via INTRAVENOUS
  Filled 2019-03-11 (×2): qty 2

## 2019-03-11 MED ORDER — MIDAZOLAM HCL 5 MG/5ML IJ SOLN
INTRAMUSCULAR | Status: DC | PRN
  Administered 2019-03-11: 2 mg via INTRAVENOUS

## 2019-03-11 MED ORDER — ACETAMINOPHEN 325 MG PO TABS
325.0000 mg | ORAL_TABLET | Freq: Four times a day (QID) | ORAL | Status: DC | PRN
  Administered 2019-03-13: 09:00:00 650 mg via ORAL
  Filled 2019-03-11: qty 2

## 2019-03-11 MED ORDER — FENTANYL CITRATE (PF) 100 MCG/2ML IJ SOLN
INTRAMUSCULAR | Status: DC | PRN
  Administered 2019-03-11: 100 ug via INTRAVENOUS
  Administered 2019-03-11 (×2): 50 ug via INTRAVENOUS

## 2019-03-11 MED ORDER — METOCLOPRAMIDE HCL 5 MG PO TABS: 5.0000 mg | ORAL_TABLET | Freq: Three times a day (TID) | ORAL | Status: DC | PRN

## 2019-03-11 MED ORDER — ACETAMINOPHEN 500 MG PO TABS
500.0000 mg | ORAL_TABLET | Freq: Four times a day (QID) | ORAL | Status: DC
  Administered 2019-03-11: 15:00:00 500 mg via ORAL
  Filled 2019-03-11: qty 1

## 2019-03-11 MED ORDER — OXYCODONE HCL 5 MG PO TABS
10.0000 mg | ORAL_TABLET | ORAL | Status: DC | PRN
  Administered 2019-03-14: 14:00:00 10 mg via ORAL

## 2019-03-11 MED ORDER — ALUM & MAG HYDROXIDE-SIMETH 200-200-20 MG/5ML PO SUSP
30.0000 mL | ORAL | Status: DC | PRN
  Administered 2019-03-14 – 2019-03-15 (×3): 30 mL via ORAL
  Filled 2019-03-11 (×3): qty 30

## 2019-03-11 MED ORDER — ACETAMINOPHEN 325 MG PO TABS: 325.0000 mg | ORAL_TABLET | Freq: Four times a day (QID) | ORAL | Status: DC | PRN

## 2019-03-11 MED ORDER — MIDAZOLAM HCL 2 MG/2ML IJ SOLN
INTRAMUSCULAR | Status: AC
  Filled 2019-03-11: qty 2

## 2019-03-11 MED ORDER — ASPIRIN 81 MG PO CHEW
81.0000 mg | CHEWABLE_TABLET | Freq: Two times a day (BID) | ORAL | Status: DC
  Administered 2019-03-12 – 2019-03-15 (×7): 81 mg via ORAL
  Filled 2019-03-11 (×7): qty 1

## 2019-03-11 MED ORDER — VANCOMYCIN HCL IN DEXTROSE 1-5 GM/200ML-% IV SOLN
INTRAVENOUS | Status: AC
  Filled 2019-03-11: qty 200

## 2019-03-11 MED ORDER — ONDANSETRON HCL 4 MG/2ML IJ SOLN
INTRAMUSCULAR | Status: DC | PRN
  Administered 2019-03-11: 4 mg via INTRAVENOUS

## 2019-03-11 MED ORDER — SODIUM CHLORIDE 0.9 % IV SOLN
INTRAVENOUS | Status: DC
  Administered 2019-03-12 – 2019-03-13 (×3): via INTRAVENOUS

## 2019-03-11 MED ORDER — METHOCARBAMOL 500 MG PO TABS
750.0000 mg | ORAL_TABLET | Freq: Four times a day (QID) | ORAL | Status: DC | PRN
  Administered 2019-03-14: 10:00:00 750 mg via ORAL
  Filled 2019-03-11 (×2): qty 2

## 2019-03-11 MED ORDER — MAGNESIUM CITRATE PO SOLN: 1.0000 | Freq: Once | ORAL | Status: DC | PRN

## 2019-03-11 MED ORDER — HYDROMORPHONE HCL 1 MG/ML IJ SOLN: 0.5000 mg | INTRAMUSCULAR | Status: DC | PRN

## 2019-03-11 MED ORDER — TRANEXAMIC ACID 1000 MG/10ML IV SOLN
INTRAVENOUS | Status: DC | PRN
  Administered 2019-03-11: 19:00:00 2000 mg via TOPICAL

## 2019-03-11 MED ORDER — GABAPENTIN 300 MG PO CAPS: 300.0000 mg | ORAL_CAPSULE | Freq: Three times a day (TID) | ORAL | Status: DC

## 2019-03-11 MED ORDER — ACETAMINOPHEN 500 MG PO TABS
1000.0000 mg | ORAL_TABLET | Freq: Four times a day (QID) | ORAL | Status: AC
  Administered 2019-03-12 (×3): 1000 mg via ORAL
  Filled 2019-03-11 (×4): qty 2

## 2019-03-11 MED ORDER — MUPIROCIN 2 % EX OINT
1.0000 "application " | TOPICAL_OINTMENT | Freq: Two times a day (BID) | CUTANEOUS | Status: DC
  Administered 2019-03-11 – 2019-03-15 (×8): 1 via NASAL
  Filled 2019-03-11 (×2): qty 22

## 2019-03-11 MED ORDER — PRAVASTATIN SODIUM 40 MG PO TABS
40.0000 mg | ORAL_TABLET | Freq: Every day | ORAL | Status: DC
  Administered 2019-03-11 – 2019-03-15 (×5): 40 mg via ORAL
  Filled 2019-03-11 (×5): qty 1

## 2019-03-11 MED ORDER — KETOROLAC TROMETHAMINE 15 MG/ML IJ SOLN
15.0000 mg | Freq: Four times a day (QID) | INTRAMUSCULAR | Status: AC
  Administered 2019-03-12 (×4): 15 mg via INTRAVENOUS
  Filled 2019-03-11 (×4): qty 1

## 2019-03-11 MED ORDER — LISINOPRIL 20 MG PO TABS
20.0000 mg | ORAL_TABLET | Freq: Every day | ORAL | Status: DC
  Administered 2019-03-11: 15:00:00 20 mg via ORAL
  Filled 2019-03-11 (×2): qty 1

## 2019-03-11 MED ORDER — POLYETHYLENE GLYCOL 3350 17 G PO PACK: 17.0000 g | PACK | Freq: Every day | ORAL | Status: DC | PRN

## 2019-03-11 MED ORDER — VANCOMYCIN HCL 1 G IV SOLR
INTRAVENOUS | Status: DC | PRN
  Administered 2019-03-11: 1000 mg via TOPICAL

## 2019-03-11 MED ORDER — ACETAMINOPHEN 325 MG PO TABS
650.0000 mg | ORAL_TABLET | Freq: Four times a day (QID) | ORAL | Status: DC | PRN
  Administered 2019-03-11: 650 mg via ORAL
  Filled 2019-03-11: qty 2

## 2019-03-11 MED ORDER — LIDOCAINE HCL (CARDIAC) PF 100 MG/5ML IV SOSY
PREFILLED_SYRINGE | INTRAVENOUS | Status: DC | PRN
  Administered 2019-03-11: 100 mg via INTRAVENOUS

## 2019-03-11 MED ORDER — VANCOMYCIN HCL 500 MG IV SOLR: 500.0000 mg | INTRAVENOUS | Status: DC

## 2019-03-11 MED ORDER — VANCOMYCIN HCL 1000 MG IV SOLR
INTRAVENOUS | Status: DC | PRN
  Administered 2019-03-11: 20:00:00 1500 mg via INTRAVENOUS

## 2019-03-11 MED ORDER — EPHEDRINE 5 MG/ML INJ
INTRAVENOUS | Status: AC
  Filled 2019-03-11: qty 10

## 2019-03-11 MED ORDER — MORPHINE SULFATE (PF) 2 MG/ML IV SOLN: 0.5000 mg | INTRAVENOUS | Status: DC | PRN

## 2019-03-11 MED ORDER — DULOXETINE HCL 30 MG PO CPEP
30.0000 mg | ORAL_CAPSULE | Freq: Every evening | ORAL | Status: DC
  Administered 2019-03-12 – 2019-03-14 (×3): 30 mg via ORAL
  Filled 2019-03-11 (×3): qty 1

## 2019-03-11 MED ORDER — LACTATED RINGERS IV SOLN
INTRAVENOUS | Status: DC | PRN
  Administered 2019-03-11: 19:00:00 via INTRAVENOUS

## 2019-03-11 MED ORDER — OXYCODONE HCL ER 10 MG PO T12A
10.0000 mg | EXTENDED_RELEASE_TABLET | Freq: Two times a day (BID) | ORAL | Status: DC
  Administered 2019-03-12 – 2019-03-15 (×7): 10 mg via ORAL
  Filled 2019-03-11 (×8): qty 1

## 2019-03-11 MED ORDER — METHOCARBAMOL 1000 MG/10ML IJ SOLN
500.0000 mg | Freq: Four times a day (QID) | INTRAVENOUS | Status: DC | PRN
  Filled 2019-03-11: qty 5

## 2019-03-11 MED ORDER — VANCOMYCIN HCL 1000 MG IV SOLR: INTRAVENOUS | Status: DC | PRN

## 2019-03-11 MED ORDER — HYDROMORPHONE HCL 1 MG/ML IJ SOLN
INTRAMUSCULAR | Status: AC
  Filled 2019-03-11: qty 1

## 2019-03-11 MED ORDER — ROCURONIUM BROMIDE 50 MG/5ML IV SOSY
PREFILLED_SYRINGE | INTRAVENOUS | Status: DC | PRN
  Administered 2019-03-11: 10 mg via INTRAVENOUS
  Administered 2019-03-11: 50 mg via INTRAVENOUS

## 2019-03-11 MED ORDER — SODIUM CHLORIDE 0.9 % IR SOLN
Status: DC | PRN
  Administered 2019-03-11: 3000 mL
  Administered 2019-03-11: 1000 mL

## 2019-03-11 MED ORDER — FENTANYL CITRATE (PF) 250 MCG/5ML IJ SOLN
INTRAMUSCULAR | Status: AC
  Filled 2019-03-11: qty 5

## 2019-03-11 MED ORDER — SODIUM CHLORIDE 0.9 % IV SOLN
INTRAVENOUS | Status: DC | PRN
  Administered 2019-03-11: 19:00:00 50 ug/min via INTRAVENOUS

## 2019-03-11 MED ORDER — PHENYLEPHRINE 40 MCG/ML (10ML) SYRINGE FOR IV PUSH (FOR BLOOD PRESSURE SUPPORT)
PREFILLED_SYRINGE | INTRAVENOUS | Status: AC
  Filled 2019-03-11: qty 10

## 2019-03-11 MED ORDER — 0.9 % SODIUM CHLORIDE (POUR BTL) OPTIME
TOPICAL | Status: DC | PRN
  Administered 2019-03-11: 1000 mL

## 2019-03-11 MED ORDER — ONDANSETRON HCL 4 MG/2ML IJ SOLN: 4.0000 mg | Freq: Four times a day (QID) | INTRAMUSCULAR | Status: DC | PRN

## 2019-03-11 MED ORDER — PROPOFOL 10 MG/ML IV BOLUS
INTRAVENOUS | Status: AC
  Filled 2019-03-11: qty 20

## 2019-03-11 MED ORDER — PIPERACILLIN-TAZOBACTAM 3.375 G IVPB 30 MIN
3.3750 g | INTRAVENOUS | Status: AC
  Administered 2019-03-11: 3.375 g via INTRAVENOUS
  Filled 2019-03-11: qty 50

## 2019-03-11 SURGICAL SUPPLY — 63 items
BAG DECANTER FOR FLEXI CONT (MISCELLANEOUS) ×3 IMPLANT
CELLS DAT CNTRL 66122 CELL SVR (MISCELLANEOUS) IMPLANT
COVER PERINEAL POST (MISCELLANEOUS) ×3 IMPLANT
COVER SURGICAL LIGHT HANDLE (MISCELLANEOUS) ×3 IMPLANT
COVER WAND RF STERILE (DRAPES) ×3 IMPLANT
DRAPE C-ARM 42X72 X-RAY (DRAPES) ×3 IMPLANT
DRAPE POUCH INSTRU U-SHP 10X18 (DRAPES) ×3 IMPLANT
DRAPE STERI IOBAN 125X83 (DRAPES) ×3 IMPLANT
DRAPE U-SHAPE 47X51 STRL (DRAPES) ×6 IMPLANT
DRSG AQUACEL AG ADV 3.5X10 (GAUZE/BANDAGES/DRESSINGS) ×3 IMPLANT
DRSG AQUACEL AG ADV 3.5X14 (GAUZE/BANDAGES/DRESSINGS) ×3 IMPLANT
DURAPREP 26ML APPLICATOR (WOUND CARE) ×6 IMPLANT
ELECT BLADE 4.0 EZ CLEAN MEGAD (MISCELLANEOUS) ×3
ELECT BLADE 6.5 EXT (BLADE) ×3 IMPLANT
ELECT REM PT RETURN 9FT ADLT (ELECTROSURGICAL) ×3
ELECTRODE BLDE 4.0 EZ CLN MEGD (MISCELLANEOUS) ×1 IMPLANT
ELECTRODE REM PT RTRN 9FT ADLT (ELECTROSURGICAL) ×1 IMPLANT
EVACUATOR 1/8 PVC DRAIN (DRAIN) ×3 IMPLANT
GAUZE SPONGE 4X4 12PLY STRL (GAUZE/BANDAGES/DRESSINGS) ×3 IMPLANT
GLOVE BIO SURGEON STRL SZ7.5 (GLOVE) ×3 IMPLANT
GLOVE BIOGEL PI IND STRL 7.0 (GLOVE) ×1 IMPLANT
GLOVE BIOGEL PI INDICATOR 7.0 (GLOVE) ×2
GLOVE ECLIPSE 7.0 STRL STRAW (GLOVE) ×6 IMPLANT
GLOVE SKINSENSE NS SZ7.5 (GLOVE) ×2
GLOVE SKINSENSE STRL SZ7.5 (GLOVE) ×1 IMPLANT
GLOVE SURG SYN 7.5  E (GLOVE) ×8
GLOVE SURG SYN 7.5 E (GLOVE) ×4 IMPLANT
GOWN STRL REIN XL XLG (GOWN DISPOSABLE) ×3 IMPLANT
GOWN STRL REUS W/ TWL LRG LVL3 (GOWN DISPOSABLE) IMPLANT
GOWN STRL REUS W/ TWL XL LVL3 (GOWN DISPOSABLE) ×1 IMPLANT
GOWN STRL REUS W/TWL LRG LVL3 (GOWN DISPOSABLE)
GOWN STRL REUS W/TWL XL LVL3 (GOWN DISPOSABLE) ×2
HANDPIECE INTERPULSE COAX TIP (DISPOSABLE) ×2
HEAD CERAMIC DELTA 36 PLUS 1.5 (Hips) ×3 IMPLANT
HOOD PEEL AWAY FLYTE STAYCOOL (MISCELLANEOUS) ×6 IMPLANT
IV NS IRRIG 3000ML ARTHROMATIC (IV SOLUTION) ×3 IMPLANT
KIT BASIN OR (CUSTOM PROCEDURE TRAY) ×3 IMPLANT
LINER NEUTRAL 54X36MM PLUS 4 (Hips) ×3 IMPLANT
MARKER SKIN DUAL TIP RULER LAB (MISCELLANEOUS) ×3 IMPLANT
NEEDLE SPNL 18GX3.5 QUINCKE PK (NEEDLE) ×3 IMPLANT
PACK TOTAL JOINT (CUSTOM PROCEDURE TRAY) ×3 IMPLANT
PACK UNIVERSAL I (CUSTOM PROCEDURE TRAY) ×3 IMPLANT
RTRCTR WOUND ALEXIS 18CM MED (MISCELLANEOUS)
SAW OSC TIP CART 19.5X105X1.3 (SAW) ×3 IMPLANT
SEALER BIPOLAR AQUA 6.0 (INSTRUMENTS) ×3 IMPLANT
SET HNDPC FAN SPRY TIP SCT (DISPOSABLE) ×1 IMPLANT
STAPLER VISISTAT 35W (STAPLE) IMPLANT
SUT ETHIBOND 2 V 37 (SUTURE) ×3 IMPLANT
SUT ETHILON 2 0 FS 18 (SUTURE) ×6 IMPLANT
SUT MON AB 2-0 CT1 36 (SUTURE) ×6 IMPLANT
SUT PDS AB 0 CT 36 (SUTURE) ×6 IMPLANT
SUT PDS AB 1 CT  36 (SUTURE) ×4
SUT PDS AB 1 CT 36 (SUTURE) ×2 IMPLANT
SUT VIC AB 1 CTX 36 (SUTURE) ×2
SUT VIC AB 1 CTX36XBRD ANBCTR (SUTURE) ×1 IMPLANT
SUT VIC AB 2-0 CT1 27 (SUTURE) ×4
SUT VIC AB 2-0 CT1 TAPERPNT 27 (SUTURE) ×2 IMPLANT
SWAB COLLECTION DEVICE MRSA (MISCELLANEOUS) ×3 IMPLANT
SWAB CULTURE ESWAB REG 1ML (MISCELLANEOUS) ×3 IMPLANT
SYR 50ML LL SCALE MARK (SYRINGE) ×3 IMPLANT
TOWEL GREEN STERILE (TOWEL DISPOSABLE) ×3 IMPLANT
TRAY CATH 16FR W/PLASTIC CATH (SET/KITS/TRAYS/PACK) IMPLANT
YANKAUER SUCT BULB TIP NO VENT (SUCTIONS) ×3 IMPLANT

## 2019-03-11 NOTE — Progress Notes (Signed)
Pt with fever of 102.3, MD notified. Order received for PRN tylenol and carried through. Patient denies chills. Will cont to monitor.

## 2019-03-11 NOTE — Telephone Encounter (Signed)
Noted. Patient has appt this morning.

## 2019-03-11 NOTE — H&P (Signed)

## 2019-03-11 NOTE — Anesthesia Postprocedure Evaluation (Signed)
Anesthesia Post Note  Patient: Dylan [name]  Procedure(s) Performed: LEFT HIP IRRIGATION AND DEBRIDEMENT, HEAD AND LINER EXCHANGE (Left )     Patient location during evaluation: PACU Anesthesia Type: General Level of consciousness: awake and alert Pain management: pain level controlled Vital Signs Assessment: post-procedure vital signs reviewed and stable Respiratory status: spontaneous breathing, nonlabored ventilation, respiratory function stable and patient connected to nasal cannula oxygen Cardiovascular status: blood pressure returned to baseline and stable Postop Assessment: no apparent nausea or vomiting Anesthetic complications: no    Last Vitals:  Vitals:   03/11/19 2300 03/11/19 2320  BP: (!) 83/56 (!) 86/54  Pulse: 93 91  Resp: (!) 22 (!) 23  Temp:    SpO2: 100% 100%    Last Pain:  Vitals:   03/11/19 2245  TempSrc:   PainSc: 4                  Catalina Gravel

## 2019-03-11 NOTE — Transfer of Care (Signed)
Immediate Anesthesia Transfer of Care Note  Patient: Dylan [name]  Procedure(s) Performed: LEFT HIP IRRIGATION AND DEBRIDEMENT, HEAD AND LINER EXCHANGE (Left )  Patient Location: PACU  Anesthesia Type:General  Level of Consciousness: awake, alert , oriented and patient cooperative  Airway & Oxygen Therapy: Patient Spontanous Breathing and Patient connected to nasal cannula oxygen  Post-op Assessment: Report given to RN and Post -op Vital signs reviewed and stable  Post vital signs: Reviewed and stable  Last Vitals:  Vitals Value Taken Time  BP    Temp    Pulse    Resp    SpO2      Last Pain:  Vitals:   03/11/19 1609  TempSrc: Oral  PainSc:          Complications: No apparent anesthesia complications

## 2019-03-11 NOTE — Anesthesia Procedure Notes (Signed)
Procedure Name: Intubation Date/Time: 03/11/2019 6:56 PM Performed by: Purvis Kilts, CRNA Pre-anesthesia Checklist: Patient identified, Emergency Drugs available, Suction available, Patient being monitored and Timeout performed Patient Re-evaluated:Patient Re-evaluated prior to induction Oxygen Delivery Method: Circle system utilized Preoxygenation: Pre-oxygenation with 100% oxygen Induction Type: IV induction Ventilation: Mask ventilation without difficulty Laryngoscope Size: Mac and 4 Grade View: Grade II Tube type: Oral Tube size: 7.5 mm Number of attempts: 1 Airway Equipment and Method: Stylet Placement Confirmation: ETT inserted through vocal cords under direct vision,  breath sounds checked- equal and bilateral and positive ETCO2 Secured at: 22 cm Tube secured with: Tape Dental Injury: Teeth and Oropharynx as per pre-operative assessment

## 2019-03-11 NOTE — Anesthesia Preprocedure Evaluation (Addendum)
Anesthesia Evaluation  Patient identified by MRN, date of birth, ID band Patient awake    Reviewed: Allergy & Precautions, NPO status , Patient's Chart, lab work & pertinent test results  History of Anesthesia Complications Negative for: history of anesthetic complications  Airway Mallampati: III  TM Distance: >3 FB     Dental  (+) Edentulous Upper, Chipped   Pulmonary sleep apnea , Not current smoker,    breath sounds clear to auscultation       Cardiovascular hypertension, Pt. on medications (-) angina(-) CAD, (-) Past MI and (-) Cardiac Stents  Rhythm:Regular Rate:Normal     Neuro/Psych Seizures -, Well Controlled,  PSYCHIATRIC DISORDERS Anxiety Depression    GI/Hepatic negative GI ROS, Neg liver ROS,   Endo/Other  Obesity   Renal/GU negative Renal ROS     Musculoskeletal  (+) Arthritis ,   Abdominal   Peds  Hematology negative hematology ROS (+)   Anesthesia Other Findings   Reproductive/Obstetrics                            Anesthesia Physical Anesthesia Plan  ASA: III  Anesthesia Plan: General   Post-op Pain Management:    Induction: Intravenous  PONV Risk Score and Plan: 2 and Ondansetron, Dexamethasone and Midazolam  Airway Management Planned: Oral ETT and Video Laryngoscope Planned  Additional Equipment:   Intra-op Plan:   Post-operative Plan: Extubation in OR  Informed Consent: I have reviewed the patients History and Physical, chart, labs and discussed the procedure including the risks, benefits and alternatives for the proposed anesthesia with the patient or authorized representative who has indicated his/her understanding and acceptance.     Dental advisory given  Plan Discussed with: CRNA and Anesthesiologist  Anesthesia Plan Comments:       Anesthesia Quick Evaluation

## 2019-03-11 NOTE — Op Note (Signed)
Date of Surgery: 03/11/2019  INDICATIONS: Dylan [name] is a 58 y.o.-year-old male who is approximately 8 weeks status post left total hip replacement who presented to my office today with a 2-day history of acute onset of severe left hip pain.  Examination office revealed purulent aspiration from the left hip.  He was subsequently admitted to the hospital to undergo irrigation debridement with head and liner exchange.  Patient gave informed consent for the procedure.  PREOPERATIVE DIAGNOSIS:  1.  Left hip prosthetic joint infection  POSTOPERATIVE DIAGNOSIS: Same.  PROCEDURE:  1.  Removal and exchange of left femoral head and left acetabular polyethylene liner 2.  Irrigation and debridement of skin, subcutaneous tissue, fascia, muscle, bone 300 cm 3.  Secondary closure of previous left hip surgical wound  SURGEON: N. Eduard Roux, M.D.  ASSIST: Laure Kidney, RNFA  ANESTHESIA:  general  IV FLUIDS AND URINE: See anesthesia.  ESTIMATED BLOOD LOSS: 800 mL.  IMPLANTS: Depuy 36 mm +1.5 ceramic head 54 mm +4 neutral polyethylene liner  DRAINS: Hemovac  COMPLICATIONS: see description of procedure.  DESCRIPTION OF PROCEDURE: The patient was brought to the operating room and placed supine on the operating table.  The patient had been signed prior to the procedure and this was documented. The patient had the anesthesia placed by the anesthesiologist.  A time-out was performed to confirm that this was the correct patient, site, side and location.  Preoperative antibiotics were held for intraoperative cultures.  The left leg was prepped and draped in standard sterile fashion.  I first made an incision around the borders of the previous surgical scar in order to fully excise this in a full-thickness fashion.  A large amount of pus was immediately encountered upon incision which was cultured.  I stuck my finger down through the subcutaneous tissue and I was able to feel all the way down through the  fascia and down onto the femoral implant.  I used the Bovie to continue my dissection down through the subcutaneous tissue onto the fascia.  The fascia had a defect centrally that communicated all the way down to the joint.  Dissection was continued down through the scar tissue until we had adequate visualization of the joint.  Retractors were placed.  The left hip joint was then dislocated with traction and external rotation and the femoral head was gently tamped off of the trunnion without difficulty.  Sharp excisional debridement of the skin, subcutaneous tissue, fascia, muscle, bone was then performed using combination of knife, rondure, curette.  Scar tissue around the proximal femur and the acetabulum was excised for visualization.  The polyethylene liner was then removed without difficulty.  There was pus between the polyethylene liner and the acetabular cup.  Fortunately the femoral stem and the acetabular cup were both well ingrown.  I used a curette to debride the purulent material from the screw holes of the acetabular cup.  I then thoroughly irrigated the acetabular cup with 3 L of normal saline using pulse lavage.  I then clear the soft tissues around the shoulder of the proximal femur and deliver the femoral stem.  After adequate debridement this was irrigated with another 3 L of normal saline.  A new polyethylene liner was then impacted into the acetabular component and a new femoral head was gently impacted onto the trunnion.  The hip was reduced.  Stability was checked with the hip in 45 degrees of extension and 90 degrees of external rotation.  A medium hemovac drain  was placed deep to the fascia and brought out the lateral hip.  The fascia was closed with #1 PDS.  The subcutaneous layer tissue was then irrigated with 3 L of normal saline and then closed with 0 PDS.  Secondary closure of the previous surgical wound closed in layered fashion with 2.0 monocryl and 2.0 nylon.  Sterile dressings were  applied.  Patient tolerated the procedure well and had no immediate complications.  POSTOPERATIVE PLAN: He will be placed on empiric vanc and zosyn until speciation.  ID will be called in the morning.  Plan is to keep the patient in the hospital through the weekend.  Wife updated tonight.  Mayra ReelN. Michael Lucifer Soja, MD 10:07 PM

## 2019-03-11 NOTE — Progress Notes (Signed)
   Post-Op Visit Note   Patient: Dylan [name]           Date of Birth: [birth year]           MRN: [id number] Visit Date: 03/11/2019 PCP: Frances Maywood, FNP   Assessment & Plan:  Chief Complaint:  Chief Complaint  Patient presents with  . Left Leg - Pain   Visit Diagnoses:  1. Status post total hip replacement, left     Plan: Thadeus is a 58 year old gentleman who is 52 days status post left total hip replacement.  Up until about 2 days ago he had smooth postoperative course.  He states that 2 days ago he did a lot of weed eating and was very active and the following day he has significant worsening in pain and swelling to his left hip.  He denies any constitutional symptoms.  He is having trouble walking and weightbearing's secondary to the pain. On examination today he has large fluctuant area around his previous surgical scar.  This is tender to palpation.  There is overlying erythema.  There is no drainage through the surgical scar.  He has low grade fever 100.3 in the office. I aspirated approximately 60 cc of murky fluid from the left hip area.  This is concerning for infection.  The fluid was sent for cell count and cultures.  I need to take him to the operating room to formally washout the hip and to evaluate for deep prosthetic joint infection.  His last oral intake was 7 AM.  He understands that he needs to be n.p.o.  We will make arrangements for surgery this afternoon.  Details of the surgery and risks and benefits discussed.  Follow-Up Instructions: Return if symptoms worsen or fail to improve.   Orders:  Orders Placed This Encounter  Procedures  . Anaerobic and Aerobic Culture  . Cell count + diff,  w/ cryst-synvl fld   No orders of the defined types were placed in this encounter.   Imaging: No results found.  PMFS History: Patient Active Problem List   Diagnosis Date Noted  . Primary osteoarthritis of left hip 01/18/2019  . Status post total replacement of  left hip 01/18/2019   Past Medical History:  Diagnosis Date  . Anxiety   . Arthritis   . Depression   . History of kidney stones   . Hypertension   . Neck pain   . Sleep apnea    to start CPAP after surgery    History reviewed. No pertinent family history.  Past Surgical History:  Procedure Laterality Date  . HAND SURGERY Left    infected hand  . SHOULDER SURGERY Left    dislocated collor bone  . TOTAL HIP ARTHROPLASTY Left 01/18/2019  . TOTAL HIP ARTHROPLASTY Left 01/18/2019   Procedure: LEFT TOTAL HIP ARTHROPLASTY ANTERIOR APPROACH;  Surgeon: Leandrew Koyanagi, MD;  Location: [facility];  Service: Orthopedics;  Laterality: Left;   Social History   Occupational History  . Not on file  Tobacco Use  . Smoking status: Never Smoker  . Smokeless tobacco: Never Used  Substance and Sexual Activity  . Alcohol use: Not Currently  . Drug use: Never  . Sexual activity: Not on file

## 2019-03-11 NOTE — Plan of Care (Signed)
  Problem: Pain Managment: Goal: General experience of comfort will improve Outcome: Progressing   Problem: Safety: Goal: Ability to remain free from injury will improve Outcome: Progressing   Problem: Skin Integrity: Goal: Risk for impaired skin integrity will decrease Outcome: Progressing   

## 2019-03-12 ENCOUNTER — Inpatient Hospital Stay: Payer: Self-pay

## 2019-03-12 DIAGNOSIS — T8452XA Infection and inflammatory reaction due to internal left hip prosthesis, initial encounter: Principal | ICD-10-CM

## 2019-03-12 DIAGNOSIS — Z8619 Personal history of other infectious and parasitic diseases: Secondary | ICD-10-CM

## 2019-03-12 DIAGNOSIS — N179 Acute kidney failure, unspecified: Secondary | ICD-10-CM

## 2019-03-12 DIAGNOSIS — M00052 Staphylococcal arthritis, left hip: Secondary | ICD-10-CM

## 2019-03-12 DIAGNOSIS — B9561 Methicillin susceptible Staphylococcus aureus infection as the cause of diseases classified elsewhere: Secondary | ICD-10-CM

## 2019-03-12 DIAGNOSIS — Z96642 Presence of left artificial hip joint: Secondary | ICD-10-CM

## 2019-03-12 LAB — CBC WITH DIFFERENTIAL/PLATELET
Abs Immature Granulocytes: 0.13 10*3/uL — ABNORMAL HIGH (ref 0.00–0.07)
Basophils Absolute: 0 10*3/uL (ref 0.0–0.1)
Basophils Relative: 0 %
Eosinophils Absolute: 0 10*3/uL (ref 0.0–0.5)
Eosinophils Relative: 0 %
HCT: 25 % — ABNORMAL LOW (ref 39.0–52.0)
Hemoglobin: 7.7 g/dL — ABNORMAL LOW (ref 13.0–17.0)
Immature Granulocytes: 1 %
Lymphocytes Relative: 3 %
Lymphs Abs: 0.6 10*3/uL — ABNORMAL LOW (ref 0.7–4.0)
MCH: 27.6 pg (ref 26.0–34.0)
MCHC: 30.8 g/dL (ref 30.0–36.0)
MCV: 89.6 fL (ref 80.0–100.0)
Monocytes Absolute: 0.7 10*3/uL (ref 0.1–1.0)
Monocytes Relative: 4 %
Neutro Abs: 16.2 10*3/uL — ABNORMAL HIGH (ref 1.7–7.7)
Neutrophils Relative %: 92 %
Platelets: 179 10*3/uL (ref 150–400)
RBC: 2.79 MIL/uL — ABNORMAL LOW (ref 4.22–5.81)
RDW: 13.1 % (ref 11.5–15.5)
WBC: 17.5 10*3/uL — ABNORMAL HIGH (ref 4.0–10.5)
nRBC: 0 % (ref 0.0–0.2)

## 2019-03-12 LAB — BASIC METABOLIC PANEL
Anion gap: 7 (ref 5–15)
BUN: 28 mg/dL — ABNORMAL HIGH (ref 6–20)
CO2: 21 mmol/L — ABNORMAL LOW (ref 22–32)
Calcium: 7.8 mg/dL — ABNORMAL LOW (ref 8.9–10.3)
Chloride: 103 mmol/L (ref 98–111)
Creatinine, Ser: 2.39 mg/dL — ABNORMAL HIGH (ref 0.61–1.24)
GFR calc Af Amer: 33 mL/min — ABNORMAL LOW (ref >60)
GFR calc non Af Amer: 29 mL/min — ABNORMAL LOW (ref >60)
Glucose, Bld: 257 mg/dL — ABNORMAL HIGH (ref 70–99)
Potassium: 4.6 mmol/L (ref 3.5–5.1)
Sodium: 131 mmol/L — ABNORMAL LOW (ref 135–145)

## 2019-03-12 LAB — PREPARE RBC (CROSSMATCH)

## 2019-03-12 LAB — CBC
HCT: 27.8 % — ABNORMAL LOW (ref 39.0–52.0)
Hemoglobin: 8.3 g/dL — ABNORMAL LOW (ref 13.0–17.0)
MCH: 27 pg (ref 26.0–34.0)
MCHC: 29.9 g/dL — ABNORMAL LOW (ref 30.0–36.0)
MCV: 90.6 fL (ref 80.0–100.0)
Platelets: 190 10*3/uL (ref 150–400)
RBC: 3.07 MIL/uL — ABNORMAL LOW (ref 4.22–5.81)
RDW: 13.2 % (ref 11.5–15.5)
WBC: 15.2 10*3/uL — ABNORMAL HIGH (ref 4.0–10.5)
nRBC: 0 % (ref 0.0–0.2)

## 2019-03-12 LAB — HIV ANTIBODY (ROUTINE TESTING W REFLEX): HIV Screen 4th Generation wRfx: NONREACTIVE

## 2019-03-12 MED ORDER — VANCOMYCIN HCL 10 G IV SOLR
1250.0000 mg | INTRAVENOUS | Status: DC
  Filled 2019-03-12: qty 1250

## 2019-03-12 MED ORDER — RIFAMPIN 300 MG PO CAPS
300.0000 mg | ORAL_CAPSULE | Freq: Two times a day (BID) | ORAL | Status: DC
  Administered 2019-03-13 – 2019-03-15 (×5): 300 mg via ORAL
  Filled 2019-03-12 (×5): qty 1

## 2019-03-12 MED ORDER — PIPERACILLIN-TAZOBACTAM 3.375 G IVPB
3.3750 g | Freq: Three times a day (TID) | INTRAVENOUS | Status: DC
  Administered 2019-03-12: 02:00:00 3.375 g via INTRAVENOUS
  Filled 2019-03-12: qty 50

## 2019-03-12 MED ORDER — SODIUM CHLORIDE 0.9% IV SOLUTION: Freq: Once | INTRAVENOUS | Status: DC

## 2019-03-12 MED ORDER — VANCOMYCIN HCL IN DEXTROSE 1-5 GM/200ML-% IV SOLN
1000.0000 mg | Freq: Three times a day (TID) | INTRAVENOUS | Status: DC
  Administered 2019-03-12: 02:00:00 1000 mg via INTRAVENOUS
  Filled 2019-03-12 (×2): qty 200

## 2019-03-12 MED ORDER — SODIUM CHLORIDE 0.9 % IV BOLUS
2000.0000 mL | Freq: Once | INTRAVENOUS | Status: AC
  Administered 2019-03-12: 12:00:00 2000 mL via INTRAVENOUS

## 2019-03-12 MED ORDER — PHENYLEPHRINE HCL-NACL 10-0.9 MG/250ML-% IV SOLN
INTRAVENOUS | Status: AC
  Filled 2019-03-12: qty 250

## 2019-03-12 MED ORDER — SODIUM CHLORIDE 0.9 % IV SOLN
2.0000 g | INTRAVENOUS | Status: DC
  Administered 2019-03-12: 10:00:00 2 g via INTRAVENOUS
  Filled 2019-03-12: qty 20

## 2019-03-12 MED ORDER — SODIUM CHLORIDE 0.9 % IV SOLN
750.0000 mg | Freq: Every day | INTRAVENOUS | Status: DC
  Administered 2019-03-13: 01:00:00 750 mg via INTRAVENOUS
  Filled 2019-03-12 (×2): qty 15

## 2019-03-12 NOTE — Progress Notes (Signed)
Physical Therapy Treatment Patient Details Name: Dylan [name] MRN: [id number] DOB: [birth year] Today's Date: 03/12/2019    History of Present Illness Pt is a 58 y/o male s/p L THA direct anterior. PMH including but not limited to HTN and previous L THA on 01/18/19.    PT Comments    Pt limited this session secondary to persistent dizziness with soft BP (93/57 mmHg in supine at beginning; 88/59 mmHg in sitting). Pt's RN was notified. Focus of session was therefore on LE strengthening therex. Pt would continue to benefit from skilled physical therapy services at this time while admitted and after d/c to address the below listed limitations in order to improve overall safety and independence with functional mobility.   Follow Up Recommendations  Home health PT;Supervision for mobility/OOB;Other (comment)(with potential to progress to OPPT)     Equipment Recommendations  None recommended by PT    Recommendations for Other Services       Precautions / Restrictions Precautions Precautions: None Restrictions Weight Bearing Restrictions: Yes LLE Weight Bearing: Weight bearing as tolerated    Mobility  Bed Mobility Overal bed mobility: Needs Assistance Bed Mobility: Supine to Sit;Sit to Supine     Supine to sit: Supervision Sit to supine: Supervision   General bed mobility comments: for safety  Transfers                 General transfer comment: deferred secondary to pt with persistent dizziness and soft BP (88/59 mmHg) - RN notified  Ambulation/Gait                 Stairs             Wheelchair Mobility    Modified Rankin (Stroke Patients Only)       Balance                                            Cognition Arousal/Alertness: Awake/alert Behavior During Therapy: WFL for tasks assessed/performed Overall Cognitive Status: Within Functional Limits for tasks assessed                                         Exercises Total Joint Exercises Ankle Circles/Pumps: AROM;Both;10 reps;Seated Heel Slides: AROM;Strengthening;Left;10 reps;Supine Hip ABduction/ADduction: AROM;Strengthening;Left;10 reps;Supine Straight Leg Raises: AAROM;Left;10 reps;Supine Long Arc Quad: AROM;Strengthening;Both;10 reps;Seated Marching in Standing: Seated;AROM;Strengthening;Both;10 reps    General Comments        Pertinent Vitals/Pain Pain Assessment: 0-10 Pain Score: 2  Pain Location: L hip Pain Descriptors / Indicators: Sore Pain Intervention(s): Monitored during session;Repositioned    Home Living                      Prior Function            PT Goals (current goals can now be found in the care plan section) Acute Rehab PT Goals PT Goal Formulation: With patient Time For Goal Achievement: 03/26/19 Potential to Achieve Goals: Good Progress towards PT goals: Progressing toward goals    Frequency    7X/week      PT Plan Current plan remains appropriate    Co-evaluation              AM-PAC PT "6 Clicks" Mobility   Outcome Measure  Help needed  turning from your back to your side while in a flat bed without using bedrails?: None Help needed moving from lying on your back to sitting on the side of a flat bed without using bedrails?: None Help needed moving to and from a bed to a chair (including a wheelchair)?: A Little Help needed standing up from a chair using your arms (e.g., wheelchair or bedside chair)?: A Little Help needed to walk in hospital room?: A Little Help needed climbing 3-5 steps with a railing? : A Little 6 Click Score: 20    End of Session   Activity Tolerance: Treatment limited secondary to medical complications (Comment);Other (comment)(persistent dizziness and low BP) Patient left: in bed;with call bell/phone within reach Nurse Communication: Mobility status;Other (comment)(low BP) PT Visit Diagnosis: Other abnormalities of gait and mobility (R26.89)      Time: 9390-3009 PT Time Calculation (min) (ACUTE ONLY): 17 min  Charges:  $Therapeutic Exercise: 8-22 mins                     Sherie Don, [place], DPT  Acute Rehabilitation Services Pager [phone] Office [place] 03/12/2019, 4:40 PM

## 2019-03-12 NOTE — Progress Notes (Signed)
Patient B/P in the lower 80's/50's, patient is symptomatic c/o dizziness and lightheadedness, all other VS WNL. MD notified and received order to give 2Ltrs NS Bolus. Patient received fluids and B/P rechecked multiple times and remains in the upper 80's/ lower 50's. . Patient states symptoms are unchanged. MD was notified and received order for STAT CBC. Results called in to MD, noting a drop in Hg from 8.3 to 7.7. Order received to transfuse 1 unit PRBC, and recheck CBC in the morning. Will cont to monitor.

## 2019-03-12 NOTE — Plan of Care (Signed)

## 2019-03-12 NOTE — Progress Notes (Signed)
Pharmacy Antibiotic Note  Dylan [name] is a 58 y.o. 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 Noted synovial fluid from 8/13 is growing gram positive cocci in clusters and operative culture growing gram positive cocci.  Pharmacy has been consulted for vancomycin dosing. Noted SCr increase from 1.42 to 2.39 today and CrCl ~ 44 ml/min. Last dose of Vancomycin was given at 0229 this AM.   Plan: Reduce Vancomycin to 1250 mg every 24 hours  Hold on AUC dosing while patient in acute AKI  Monitor renal function, operative cultures      Temp (24hrs), Avg:99.5 F (37.5 C), Min:97.5 F (36.4 C), Max:102.3 F (39.1 C)  Recent Labs  Lab 03/11/19 1425 03/12/19 0320  WBC 16.3* 15.2*  CREATININE 1.42* 2.39*    Estimated Creatinine Clearance: 43.7 mL/min (A) (by C-G formula based on SCr of 2.39 mg/dL (H)).    No Known Allergies  Antimicrobials this admission: 8/13 Vanc>> 8/13 Zosyn>>8/14 8/14 Ceftriaxone>>     Thank you for allowing pharmacy to be a part of this patient's care.  Jimmy Footman, PharmD, BCPS, BCIDP Infectious Diseases Clinical Pharmacist Phone: [phone] 03/12/2019 9:01 AM

## 2019-03-12 NOTE — Progress Notes (Signed)
Pharmacy Antibiotic Note  Dylan [name] is a 58 y.o. male admitted on 03/11/2019 with prosthetic hip joint infection.  Pharmacy has been consulted for Vancomycin and Zosyn dosing.  Vancomycin 1 g IV given preop at [facility]: Vancomycin 1 IV q8h Zosyn 3.375 g IV q8h      Temp (24hrs), Avg:99.8 F (37.7 C), Min:98.2 F (36.8 C), Max:102.3 F (39.1 C)  Recent Labs  Lab 03/11/19 1425  WBC 16.3*  CREATININE 1.42*    Estimated Creatinine Clearance: 73.5 mL/min (A) (by C-G formula based on SCr of 1.42 mg/dL (H)).    No Known Allergies   Caryl Pina 03/12/2019 12:04 AM

## 2019-03-12 NOTE — Consult Note (Signed)
Date of Admission:  03/11/2019          Reason for Consult: Prosthetic hip infection with staph aureus    Referring Provider: Dr. Erlinda Hong   Assessment:  1. Prosthetic hip infection status post I&D and single staged exchange arthroplasty   2. With staph coccus aureus growing from cultures  3. 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 AKI  Plan:   1.  Switch to daptomycin pending sensis on his Staphylococcus aureus 2. Add rifampin if no problems with DDI and tolerability 3. Plan on 6 weeks of IV therapy followed by additional 5 months of oral antibiotics plus or minus further therapy   Principal Problem:   Prosthetic joint infection of left hip (HCC) Active Problems:   Status post total replacement of left hip   Scheduled Meds: . acetaminophen  1,000 mg Oral Q6H  . amLODipine  10 mg Oral QPM  . aspirin  81 mg Oral BID  . Chlorhexidine Gluconate Cloth  6 each Topical Daily  . docusate sodium  100 mg Oral BID  . DULoxetine  30 mg Oral QPM  . gabapentin  800 mg Oral QID  . ketorolac  15 mg Intravenous Q6H  . mupirocin ointment  1 application Nasal BID  . oxyCODONE  10 mg Oral Q12H  . pravastatin  40 mg Oral Daily  . [START ON 03/13/2019] rifampin  300 mg Oral Q12H   Continuous Infusions: . sodium chloride 75 mL/hr at 03/12/19 0015  . DAPTOmycin (CUBICIN)  IV    . methocarbamol (ROBAXIN) IV     PRN Meds:.acetaminophen, alum & mag hydroxide-simeth, diphenhydrAMINE, HYDROcodone-acetaminophen, HYDROmorphone (DILAUDID) injection, magnesium citrate, menthol-cetylpyridinium **OR** phenol, methocarbamol **OR** methocarbamol (ROBAXIN) IV, metoCLOPramide **OR** metoCLOPramide (REGLAN) injection, ondansetron **OR** ondansetron (ZOFRAN) IV, oxyCODONE, oxyCODONE, polyethylene glycol, sorbitol, sorbitol  HPI: Dylan [name] is a 58 y.o. male who tells me he has a history of prior Staphylococcus aureus infections including in his hand and in his  left elbow.  He underwent total hip arthroplasty on January 18, 2019.  He had been doing relatively well but then abruptly on August 12 had developed intense pain at the operative site with swelling where there was found to be a large fluctuant area around his prior scar.  He had been febrile and when he saw Dr. ask you in his office he had a temperature of 100.3 he aspirated 60 cc of murky fluid in the hip as well.  This has subsequently yielded Staphylococcus aureus.  He is brought brought to the operating room and underwent I&D with poly-exchange of his hip arthroplasty yesterday.  Patient did develop acute kidney injury which is worsened during admission.  He is growing Staphylococcus aureus from cultures.  We will change him over to daptomycin to avoid further nephrotoxicity.  I would also like to add rifampin if he can tolerate this.  We will plan on treating him for 6 weeks with parenteral therapy followed by 5 to 6 months of oral therapy with close monitoring of his pain and inflammatory markers.     Review of Systems: Review of Systems  Constitutional: Positive for fever and malaise/fatigue. Negative for chills, diaphoresis and weight loss.  HENT: Negative for congestion, hearing loss, sore throat and tinnitus.   Eyes: Negative for blurred vision and double vision.  Respiratory: Negative for cough, sputum production, shortness of breath and wheezing.   Cardiovascular: Negative for chest pain, palpitations and leg swelling.  Gastrointestinal: Negative for abdominal pain, blood in stool, constipation, diarrhea, heartburn, melena, nausea and vomiting.  Genitourinary: Negative for dysuria, flank pain and hematuria.  Musculoskeletal: Positive for joint pain and myalgias. Negative for back pain and falls.  Skin: Negative for itching and rash.  Neurological: Negative for dizziness, sensory change, focal weakness, loss of consciousness, weakness and headaches.  Endo/Heme/Allergies: Does not  bruise/bleed easily.  Psychiatric/Behavioral: Negative for depression, memory loss and suicidal ideas. The patient is not nervous/anxious.     Past Medical History:  Diagnosis Date  . Anxiety   . Arthritis   . Depression   . History of kidney stones   . Hypertension   . Neck pain   . Sleep apnea    to start CPAP after surgery    Social History   Tobacco Use  . Smoking status: Never Smoker  . Smokeless tobacco: Never Used  Substance Use Topics  . Alcohol use: Not Currently  . Drug use: Never    History reviewed. No pertinent family history. No Known Allergies  OBJECTIVE: Blood pressure (!) (P) 88/52, pulse 80, temperature 97.7 F (36.5 C), temperature source Oral, resp. rate 16, SpO2 100 %.  Physical Exam Constitutional:      General: He is not in acute distress.    Appearance: Normal appearance. He is well-developed. He is not ill-appearing or diaphoretic.  HENT:     Head: Normocephalic and atraumatic.     Right Ear: Hearing and external ear normal.     Left Ear: Hearing and external ear normal.     Nose: No nasal deformity or rhinorrhea.  Eyes:     General: No scleral icterus.    Conjunctiva/sclera: Conjunctivae normal.     Right eye: Right conjunctiva is not injected.     Left eye: Left conjunctiva is not injected.  Neck:     Musculoskeletal: Normal range of motion and neck supple.     Vascular: No JVD.  Cardiovascular:     Rate and Rhythm: Normal rate and regular rhythm.     Heart sounds: S1 normal and S2 normal.  Pulmonary:     Effort: Pulmonary effort is normal. No respiratory distress.  Abdominal:     General: There is no distension.     Palpations: Abdomen is soft.  Musculoskeletal:     Right shoulder: Normal.     Left shoulder: Normal.     Right hip: Normal.     Left hip: Normal.     Right knee: Normal.     Left knee: Normal.  Lymphadenopathy:     Head:     Right side of head: No submandibular, preauricular or posterior auricular adenopathy.      Left side of head: No submandibular, preauricular or posterior auricular adenopathy.     Cervical: No cervical adenopathy.     Right cervical: No superficial or deep cervical adenopathy.    Left cervical: No superficial or deep cervical adenopathy.  Skin:    General: Skin is warm and dry.     Coloration: Skin is not pale.     Findings: No abrasion, bruising, ecchymosis, erythema, lesion or rash.     Nails: There is no clubbing.   Neurological:     General: No focal deficit present.     Mental Status: He is alert and oriented to person, place, and time.     Sensory: No sensory deficit.     Coordination: Coordination normal.     Gait: Gait normal.  Psychiatric:  Attention and Perception: He is attentive.        Mood and Affect: Mood normal.        Speech: Speech normal.        Behavior: Behavior normal. Behavior is cooperative.        Thought Content: Thought content normal.        Judgment: Judgment normal.    Hip with bandage  Lab Results Lab Results  Component Value Date   WBC 15.2 (H) 03/12/2019   HGB 8.3 (L) 03/12/2019   HCT 27.8 (L) 03/12/2019   MCV 90.6 03/12/2019   PLT 190 03/12/2019    Lab Results  Component Value Date   CREATININE 2.39 (H) 03/12/2019   BUN 28 (H) 03/12/2019   NA 131 (L) 03/12/2019   K 4.6 03/12/2019   CL 103 03/12/2019   CO2 21 (L) 03/12/2019    Lab Results  Component Value Date   ALT 24 01/14/2019   AST 23 01/14/2019   ALKPHOS 84 01/14/2019   BILITOT 1.0 01/14/2019     Microbiology: Recent Results (from the past 240 hour(s))  Anaerobic and Aerobic Culture     Status: Abnormal (Preliminary result)   Collection Time: 03/11/19  9:11 AM   Specimen: Joint, Hip; Synovial Fluid  Result Value Ref Range Status   MICRO NUMBER: [id number]  Preliminary   SPECIMEN QUALITY: Adequate  Preliminary   Source: SYNOVIAL FLUID  Preliminary   STATUS: PRELIMINARY  Preliminary   GRAM STAIN: (AA)  Preliminary    Many Polymorphonuclear  leukocytes Moderate Gram positive cocci in clusters  SARS Coronavirus 2 [facility](Hospital order, Performed in [facility] hospital lab) Nasopharyngeal Nasopharyngeal Swab     Status: None   Collection Time: 03/11/19 10:59 AM   Specimen: Nasopharyngeal Swab  Result Value Ref Range Status   SARS Coronavirus 2 NEGATIVE NEGATIVE Final    Comment: (NOTE) If result is NEGATIVE SARS-CoV-2 target nucleic acids are NOT DETECTED. The SARS-CoV-2 RNA is generally detectable in upper and lower  respiratory specimens during the acute phase of infection. The lowest  concentration of SARS-CoV-2 viral copies this assay can detect is 250  copies / mL. A negative result does not preclude SARS-CoV-2 infection  and should not be used as the sole basis for treatment or other  patient management decisions.  A negative result may occur with  improper specimen collection / handling, submission of specimen other  than nasopharyngeal swab, presence of viral mutation(s) within the  areas targeted by this assay, and inadequate number of viral copies  (<250 copies / mL). A negative result must be combined with clinical  observations, patient history, and epidemiological information. If result is POSITIVE SARS-CoV-2 target nucleic acids are DETECTED. The SARS-CoV-2 RNA is generally detectable in upper and lower  respiratory specimens dur ing the acute phase of infection.  Positive  results are indicative of active infection with SARS-CoV-2.  Clinical  correlation with patient history and other diagnostic information is  necessary to determine patient infection status.  Positive results do  not rule out bacterial infection or co-infection with other viruses. If result is PRESUMPTIVE POSTIVE SARS-CoV-2 nucleic acids MAY BE PRESENT.   A presumptive positive result was obtained on the submitted specimen  and confirmed on repeat testing.  While 2019 novel coronavirus  (SARS-CoV-2) nucleic acids may be present in the submitted  sample  additional confirmatory testing may be necessary for epidemiological  and / or clinical management purposes  to differentiate between  SARS-CoV-2 and  other Sarbecovirus currently known to infect humans.  If clinically indicated additional testing with an alternate test  methodology [id number]) is advised. The SARS-CoV-2 RNA is generally  detectable in upper and lower respiratory sp ecimens during the acute  phase of infection. The expected result is Negative. Fact Sheet for Patients:  BoilerBrush.com.cyhttps://www.fda.gov/media/136312/download Fact Sheet for Healthcare Providers: https://pope.com/https://www.fda.gov/media/136313/download This test is not yet approved or cleared by the [place] FDA and has been authorized for detection and/or diagnosis of SARS-CoV-2 by FDA under an Emergency Use Authorization (EUA).  This EUA will remain in effect (meaning this test can be used) for the duration of the COVID-19 declaration under Section 564(b)(1) of the Act, 21 U.S.C. section 360bbb-3(b)(1), unless the authorization is terminated or revoked sooner. Performed at [facility], [address] [place], [place] [address]   Surgical pcr screen     Status: Abnormal   Collection Time: 03/11/19 12:24 PM   Specimen: Nasal Mucosa; Nasal Swab  Result Value Ref Range Status   MRSA, PCR NEGATIVE NEGATIVE Final   Staphylococcus aureus POSITIVE (A) NEGATIVE Final    Comment: (NOTE) The Xpert SA Assay (FDA approved for NASAL specimens in patients 60 years of age and older), is one component of a comprehensive surveillance program. It is not intended to diagnose infection nor to guide or monitor treatment. Performed at [facility], [address] [place], [place] [address]   Aerobic/Anaerobic Culture (surgical/deep wound)     Status: None (Preliminary result)   Collection Time: 03/11/19  7:19 PM   Specimen: Abscess  Result Value Ref Range Status   Specimen Description ABSCESS LEFT HIP   Final   Special Requests NONE  Final   Gram Stain   Final    FEW WBC PRESENT,BOTH PMN AND MONONUCLEAR RARE GRAM POSITIVE COCCI    Culture   Final    MODERATE STAPHYLOCOCCUS AUREUS SUSCEPTIBILITIES TO FOLLOW Performed at [facility], [address] [place], [place] [address]    Report Status PENDING  Incomplete    Acey Lavornelius Van Dam, MD [facility] [phone] pager  03/12/2019, 12:07 PM

## 2019-03-12 NOTE — Evaluation (Signed)
Physical Therapy Evaluation Patient Details Name: Dylan [name] MRN: [id number] DOB: May 30, 1961 Today's Date: 03/12/2019   History of Present Illness  Pt is a 58 y/o male s/p L THA direct anterior. PMH including but not limited to HTN and previous L THA on 01/18/19.  Clinical Impression  Pt presented supine in bed with HOB elevated, awake and willing to participate in therapy session. Prior to admission, pt reported that he was independent with all functional mobility and ADLs. Pt lives with his wife in a single level home with three steps to enter. At the time of evaluation, pt at supervision to min guard level with all mobility with use of RW. Suspect pt with likely progress very well with PT while admitted. Pt would continue to benefit from skilled physical therapy services at this time while admitted and after d/c to address the below listed limitations in order to improve overall safety and independence with functional mobility.     Follow Up Recommendations Home health PT;Supervision for mobility/OOB;Other (comment)(with potential to progress to OP PT)    Equipment Recommendations  None recommended by PT    Recommendations for Other Services       Precautions / Restrictions Precautions Precautions: Other (comment) Precaution Comments: drain at L hip Restrictions Weight Bearing Restrictions: Yes LLE Weight Bearing: Weight bearing as tolerated      Mobility  Bed Mobility Overal bed mobility: Needs Assistance Bed Mobility: Supine to Sit     Supine to sit: Supervision     General bed mobility comments: for safety  Transfers Overall transfer level: Needs assistance Equipment used: Rolling walker (2 wheeled) Transfers: Sit to/from Stand Sit to Stand: Min guard         General transfer comment: cueing for hand placement, min guard for safety  Ambulation/Gait Ambulation/Gait assistance: Min guard Gait Distance (Feet): 5 Feet Assistive device: Rolling walker (2  wheeled) Gait Pattern/deviations: Step-through pattern;Decreased stride length Gait velocity: decreased   General Gait Details: limited secondary to dizziness; steady with RW, no instability or LOB  Stairs            Wheelchair Mobility    Modified Rankin (Stroke Patients Only)       Balance Overall balance assessment: Needs assistance Sitting-balance support: Feet supported Sitting balance-Leahy Scale: Good     Standing balance support: Bilateral upper extremity supported Standing balance-Leahy Scale: Poor                               Pertinent Vitals/Pain Pain Assessment: 0-10 Pain Score: 5  Pain Location: L hip Pain Descriptors / Indicators: Sore Pain Intervention(s): Monitored during session;Repositioned    Home Living Family/patient expects to be discharged to:: Private residence Living Arrangements: Spouse/significant other Available Help at Discharge: Family;Available 24 hours/day Type of Home: House Home Access: Stairs to enter Entrance Stairs-Rails: Doctor, general practiceight;Left Entrance Stairs-Number of Steps: 3 Home Layout: One level Home Equipment: Walker - 2 wheels;Cane - single point      Prior Function Level of Independence: Independent               Hand Dominance        Extremity/Trunk Assessment   Upper Extremity Assessment Upper Extremity Assessment: Overall WFL for tasks assessed    Lower Extremity Assessment Lower Extremity Assessment: LLE deficits/detail LLE Deficits / Details: decreased strength and AROM limitations secondary to post-op pain and weakness    Cervical / Trunk Assessment Cervical / Trunk  Assessment: Normal  Communication   Communication: No difficulties  Cognition Arousal/Alertness: Awake/alert Behavior During Therapy: WFL for tasks assessed/performed Overall Cognitive Status: Within Functional Limits for tasks assessed                                        General Comments       Exercises     Assessment/Plan    PT Assessment Patient needs continued PT services  PT Problem List Decreased strength;Decreased range of motion;Decreased activity tolerance;Decreased balance;Decreased mobility;Decreased coordination;Decreased knowledge of use of DME;Decreased safety awareness;Decreased knowledge of precautions;Pain       PT Treatment Interventions DME instruction;Gait training;Stair training;Functional mobility training;Therapeutic activities;Therapeutic exercise;Balance training;Neuromuscular re-education;Patient/family education    PT Goals (Current goals can be found in the Care Plan section)  Acute Rehab PT Goals Patient Stated Goal: decrease pain PT Goal Formulation: With patient Time For Goal Achievement: 03/26/19 Potential to Achieve Goals: Good    Frequency 7X/week   Barriers to discharge        Co-evaluation               AM-PAC PT "6 Clicks" Mobility  Outcome Measure Help needed turning from your back to your side while in a flat bed without using bedrails?: None Help needed moving from lying on your back to sitting on the side of a flat bed without using bedrails?: None Help needed moving to and from a bed to a chair (including a wheelchair)?: A Little Help needed standing up from a chair using your arms (e.g., wheelchair or bedside chair)?: A Little Help needed to walk in hospital room?: A Little Help needed climbing 3-5 steps with a railing? : A Little 6 Click Score: 20    End of Session Equipment Utilized During Treatment: Gait belt Activity Tolerance: Patient tolerated treatment well Patient left: in chair;with call bell/phone within reach Nurse Communication: Mobility status PT Visit Diagnosis: Other abnormalities of gait and mobility (R26.89)    Time: 2956-2130 PT Time Calculation (min) (ACUTE ONLY): 14 min   Charges:   PT Evaluation $PT Eval Moderate Complexity: 1 Mod          Sherie Don, PT, DPT  Acute  Rehabilitation Services Pager [phone] Office [place] 03/12/2019, 12:33 PM

## 2019-03-12 NOTE — Progress Notes (Signed)
Pharmacy Antibiotic Note  Dylan [name] is a 58 y.o. 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 Noted synovial fluid from 8/13 is growing gram positive cocci in clusters and operative culture growing gram positive cocci.  Pharmacy has been consulted for vancomycin  dosing. Noted SCr increase from 1.42 to 2.39 today and CrCl ~ 44 ml/min. Last dose of Vancomycin was given at 0229 this AM.   Plan: Reduce Vancomycin to 1250 mg every 24 hours  Hold on AUC dosing while patient in acute AKI  Monitor renal function, operative cultures      ADDENDUM: Now switching to Daptomycin for acute AKI.  Will start Daptomycin 750 mg (~ 8 mg/kg AdjBW) every 24 hours starting this evening Weekly CK on Saturday starting tomorrow  Note patient is also on Lisinopril and Ketorolac which could also be contributing to AKI   Temp (24hrs), Avg:99.5 F (37.5 C), Min:97.5 F (36.4 C), Max:102.3 F (39.1 C)  Recent Labs  Lab 03/11/19 1425 03/12/19 0320  WBC 16.3* 15.2*  CREATININE 1.42* 2.39*    Estimated Creatinine Clearance: 43.7 mL/min (A) (by C-G formula based on SCr of 2.39 mg/dL (H)).    No Known Allergies  Antimicrobials this admission: 8/14 Dapto>> 8/13 Vanc>>8/14 8/13 Zosyn>>8/14 8/14 Ceftriaxone>>8/14     Thank you for allowing pharmacy to be a part of this patient's care.  Jimmy Footman, PharmD, BCPS, [facility] Clinical Pharmacist Phone: [phone] 03/12/2019 9:40 AM

## 2019-03-12 NOTE — Progress Notes (Signed)
   Subjective:  Patient reports pain as significantly improved. No events overnight.  Objective:   VITALS:   Vitals:   03/11/19 2320 03/11/19 2349 03/12/19 0524 03/12/19 0648  BP: (!) 86/54 (!) 81/52 (!) 72/59 (!) 90/53  Pulse: 91 87 70 70  Resp: (!) 23 16 16    Temp: 98.2 F (36.8 C) 98.2 F (36.8 C) (!) 97.5 F (36.4 C)   TempSrc:  Oral Oral   SpO2: 100% 99% 100%     Neurologically intact Neurovascular intact Sensation intact distally Intact pulses distally Dorsiflexion/Plantar flexion intact Incision: dressing C/D/I and no drainage No cellulitis present Compartment soft HVAC with good suction   Lab Results  Component Value Date   WBC 15.2 (H) 03/12/2019   HGB 8.3 (L) 03/12/2019   HCT 27.8 (L) 03/12/2019   MCV 90.6 03/12/2019   PLT 190 03/12/2019     Assessment/Plan:  1 Day Post-Op   - Expected postop acute blood loss anemia - will monitor for symptoms - Up with PT/OT - DVT ppx - SCDs, ambulation, aspirin - WBAT operative extremity - continue vanc and zosyn - cultures pending, GPC on gram stain - continue HVAC - will consult ID  Eduard Roux 03/12/2019, 7:54 AM [phone]

## 2019-03-13 LAB — SEDIMENTATION RATE: Sed Rate: 45 mm/hr — ABNORMAL HIGH (ref 0–16)

## 2019-03-13 LAB — CBC
HCT: 27.7 % — ABNORMAL LOW (ref 39.0–52.0)
Hemoglobin: 8.7 g/dL — ABNORMAL LOW (ref 13.0–17.0)
MCH: 27.8 pg (ref 26.0–34.0)
MCHC: 31.4 g/dL (ref 30.0–36.0)
MCV: 88.5 fL (ref 80.0–100.0)
Platelets: 213 10*3/uL (ref 150–400)
RBC: 3.13 MIL/uL — ABNORMAL LOW (ref 4.22–5.81)
RDW: 13.3 % (ref 11.5–15.5)
WBC: 19.4 10*3/uL — ABNORMAL HIGH (ref 4.0–10.5)
nRBC: 0 % (ref 0.0–0.2)

## 2019-03-13 LAB — BASIC METABOLIC PANEL
Anion gap: 8 (ref 5–15)
BUN: 34 mg/dL — ABNORMAL HIGH (ref 6–20)
CO2: 21 mmol/L — ABNORMAL LOW (ref 22–32)
Calcium: 8 mg/dL — ABNORMAL LOW (ref 8.9–10.3)
Chloride: 106 mmol/L (ref 98–111)
Creatinine, Ser: 1.65 mg/dL — ABNORMAL HIGH (ref 0.61–1.24)
GFR calc Af Amer: 52 mL/min — ABNORMAL LOW (ref >60)
GFR calc non Af Amer: 45 mL/min — ABNORMAL LOW (ref >60)
Glucose, Bld: 165 mg/dL — ABNORMAL HIGH (ref 70–99)
Potassium: 4.6 mmol/L (ref 3.5–5.1)
Sodium: 135 mmol/L (ref 135–145)

## 2019-03-13 LAB — CK: Total CK: 197 U/L (ref 49–397)

## 2019-03-13 LAB — C-REACTIVE PROTEIN: CRP: 18.9 mg/dL — ABNORMAL HIGH (ref <1.0)

## 2019-03-13 MED ORDER — SODIUM CHLORIDE 0.9% FLUSH
10.0000 mL | INTRAVENOUS | Status: DC | PRN
  Administered 2019-03-14: 04:00:00 10 mL
  Filled 2019-03-13: qty 40

## 2019-03-13 MED ORDER — CEFAZOLIN SODIUM-DEXTROSE 2-4 GM/100ML-% IV SOLN
2.0000 g | Freq: Three times a day (TID) | INTRAVENOUS | Status: DC
  Administered 2019-03-13 – 2019-03-15 (×6): 2 g via INTRAVENOUS
  Filled 2019-03-13 (×6): qty 100

## 2019-03-13 NOTE — Progress Notes (Signed)
PHARMACY CONSULT NOTE FOR:  OUTPATIENT  PARENTERAL ANTIBIOTIC THERAPY (OPAT)  Indication: Prosethetic Joint Infection Regimen: Cefazolin 2g IV every 8 hours End date: 04/21/2019  IV antibiotic discharge orders are pended. To discharging provider:  please sign these orders via discharge navigator,  Select New Orders & click on the button choice - Manage This Unsigned Work.     Thank you for the interesting consult and for involving pharmacy in this patient's care.  Tamela Gammon, PharmD 03/13/2019 12:39 PM PGY-1 Pharmacy Resident Direct Phone: [phone] Please check [email or web address] for unit-specific pharmacist phone numbers

## 2019-03-13 NOTE — Progress Notes (Signed)
Peripherally Inserted Central Catheter/Midline Placement  The IV Nurse has discussed with the patient and/or persons authorized to consent for the patient, the purpose of this procedure and the potential benefits and risks involved with this procedure.  The benefits include less needle sticks, lab draws from the catheter, and the patient may be discharged home with the catheter. Risks include, but not limited to, infection, bleeding, blood clot (thrombus formation), and puncture of an artery; nerve damage and irregular heartbeat and possibility to perform a PICC exchange if needed/ordered by physician.  Alternatives to this procedure were also discussed.  Bard Power PICC patient education guide, fact sheet on infection prevention and patient information card has been provided to patient /or left at bedside.    PICC/Midline Placement Documentation  PICC Single Lumen 03/13/19 PICC Right Brachial 42 cm 2 cm (Active)  Indication for Insertion or Continuance of Line Home intravenous therapies (PICC only) 03/13/19 1300  Exposed Catheter (cm) 2 cm 03/13/19 1300  Site Assessment Clean;Dry;Intact 03/13/19 1300  Line Status Flushed;Saline locked;Blood return noted 03/13/19 1300  Dressing Type Transparent;Securing device 03/13/19 1300  Dressing Status Clean;Dry;Intact;Antimicrobial disc in place 03/13/19 1300  Dressing Change Due 03/20/19 03/13/19 1300       Holley Bouche [place] 03/13/2019, 1:07 PM

## 2019-03-13 NOTE — Progress Notes (Signed)
Physical Therapy Treatment Patient Details Name: Dylan [name] MRN: [id number] DOB: [birth year] Today's Date: 03/13/2019    History of Present Illness Pt is a 58 y/o male s/p removal and exchange of L femoral head and L acetabular polyethylene liner with I&D. PMH including but not limited to HTN and previous L THA on 01/18/19.    PT Comments    Pt continuing to make excellent progress with functional mobility. Stair training initiated this session with no difficulties. PT will continue to follow acutely to progress mobility as tolerated and to ensure a safe d/c home.   Follow Up Recommendations  No PT follow up     Equipment Recommendations  None recommended by PT    Recommendations for Other Services       Precautions / Restrictions Precautions Precautions: None Restrictions Weight Bearing Restrictions: Yes LLE Weight Bearing: Weight bearing as tolerated    Mobility  Bed Mobility               General bed mobility comments: pt OOB in recliner chair upon arrival  Transfers Overall transfer level: Needs assistance Equipment used: Rolling walker (2 wheeled) Transfers: Sit to/from Stand Sit to Stand: Supervision         General transfer comment: supervision for safety, no instability or LOB with transition; good technique utilized  Ambulation/Gait Ambulation/Gait assistance: Supervision Gait Distance (Feet): 500 Feet Assistive device: Rolling walker (2 wheeled) Gait Pattern/deviations: Step-through pattern;Decreased stride length Gait velocity: able to fluctuate   General Gait Details: pt generally steady overall with RW, no LOB or need for physical assistance; pt able to increase gait speed as well   Stairs Stairs: Yes Stairs assistance: Min guard Stair Management: Two rails;Step to pattern;Alternating pattern;Forwards Number of Stairs: 5(2 standard steps, 3 half steps; 2 trials) General stair comments: no instability or LOB, use of bilateral railing;  progressing from step-to to alternating step pattern   Wheelchair Mobility    Modified Rankin (Stroke Patients Only)       Balance Overall balance assessment: Needs assistance Sitting-balance support: Feet supported Sitting balance-Leahy Scale: Good     Standing balance support: During functional activity Standing balance-Leahy Scale: Fair                              Cognition Arousal/Alertness: Awake/alert Behavior During Therapy: WFL for tasks assessed/performed Overall Cognitive Status: Within Functional Limits for tasks assessed                                        Exercises      General Comments        Pertinent Vitals/Pain Pain Assessment: 0-10 Pain Score: 5  Pain Location: L hip Pain Descriptors / Indicators: Sore Pain Intervention(s): Monitored during session    Home Living                      Prior Function            PT Goals (current goals can now be found in the care plan section) Acute Rehab PT Goals PT Goal Formulation: With patient Time For Goal Achievement: 03/26/19 Potential to Achieve Goals: Good Progress towards PT goals: Progressing toward goals    Frequency    Min 5X/week      PT Plan Discharge plan needs to be updated;Frequency needs to  be updated    Co-evaluation              AM-PAC PT "6 Clicks" Mobility   Outcome Measure  Help needed turning from your back to your side while in a flat bed without using bedrails?: None Help needed moving from lying on your back to sitting on the side of a flat bed without using bedrails?: None Help needed moving to and from a bed to a chair (including a wheelchair)?: None Help needed standing up from a chair using your arms (e.g., wheelchair or bedside chair)?: None Help needed to walk in hospital room?: None Help needed climbing 3-5 steps with a railing? : None 6 Click Score: 24    End of Session   Activity Tolerance: Patient  tolerated treatment well Patient left: in chair;with call bell/phone within reach Nurse Communication: Mobility status PT Visit Diagnosis: Other abnormalities of gait and mobility (R26.89)     Time: 4098-11911415-1428 PT Time Calculation (min) (ACUTE ONLY): 13 min  Charges:  $Gait Training: 8-22 mins                     Deborah ChalkJennifer Louanna Vanliew, [place] CarolinaPT, DPT  Acute Rehabilitation Services Pager [phone] Office [phone]     Alessandra BevelsJennifer M Skylor Hughson 03/13/2019, 4:05 PM

## 2019-03-13 NOTE — Progress Notes (Addendum)
      INFECTIOUS DISEASE ATTENDING ADDENDUM:   Date: 03/13/2019  Patient name: Dylan [name]  Medical record number: [id number]  Date of birth: 1961/01/12   Patient was not his room I saw him.  I see that his Staphylococcus aureus is fortunately methicillin sensitive.  We will change him to cefazolin  Plan for IV antibiotics is as follows  Diagnosis: Prosthetic hip infection  Culture Result: MSSA  No Known Allergies  OPAT Orders Discharge antibiotics:  Cefazolin 2 g IV every 8 hours  And oral rifampin 300 mg every 12 hours    Duration:  6 weeks  End Date:  April 21, 2019  [facility] Care Per Protocol:    Labs  weekly while on IV antibiotics:  _x_ CBC with differential _x_ BMP w GFR/CMP x_ CRP _x_ ESR   x__ Please pull PIC at completion of IV antibiotics __ Please leave PIC in place until doctor has seen patient or been notified  Fax weekly labs to [phone]   Dylan [name] has an appointment with Dr. Tommy Medal on September the 16th, 2020 at 11 AM.  He should arrive 15 to 30 minutes before the appointment  The [facility] is located in the [facility] at  [address] in [place].  [address], which is located to the left of the elevators.  Phone: [phone]  Fax: [phone]  [email or web address]    Dylan [name] 03/13/2019, 12:04 PM

## 2019-03-13 NOTE — Progress Notes (Addendum)
Pharmacy Antibiotic Note  Dylan [name] is a 58 y.o. 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 s/p left hip arthroplasty in June 2020. Noted synovial fluid from 8/13 is growing gram positive cocci in clusters and operative culture growing gram positive cocci. Patient now has grown out staphylococcus aureus, resistant to erythromycin, clindamycin, sensitive to cefazolin. Patient was on vanc >>> daptomycin which has been stopped by ID and now pharmacy has been consulted for cefazolin dosing.  Plan: Stop daptomycin Start Cefazolin 2g IV every 8 hours Follow-up signs/symptoms of infection, clinical improvement, Scr, LOT.  Temp (24hrs), Avg:98 F (36.7 C), Min:97.7 F (36.5 C), Max:98.3 F (36.8 C)  Recent Labs  Lab 03/11/19 1425 03/12/19 0320 03/12/19 1615 03/13/19 0605  WBC 16.3* 15.2* 17.5* 19.4*  CREATININE 1.42* 2.39*  --  1.65*    Estimated Creatinine Clearance: 63.3 mL/min (A) (by C-G formula based on SCr of 1.65 mg/dL (H)).    No Known Allergies  Antimicrobials this admission: 8/15 Cefazolin >> 8/15 Rifampin >> 8/14 Dapto>>8/15 8/13 Vanc>>8/14 8/13 Zosyn>>8/14 8/14 Ceftriaxone>>8/14   Dose adjustments this admission:   Microbiology results: 8/13 BCx: Staphylococcus aureus (sensitive to cipro, genta, oxacillin, tetracyclin, vanc, bactrim and rifampin)  8/13 MRSA PCR: negative  Thank you for the interesting consult and for involving pharmacy in this patient's care.  Tamela Gammon, PharmD 03/13/2019 11:45 AM PGY-1 Pharmacy Resident Direct Phone: [phone] Please check [email or web address] for unit-specific pharmacist phone numbers

## 2019-03-13 NOTE — Progress Notes (Signed)
Subjective: 2 Days Post-Op Procedure(s) (LRB): LEFT HIP IRRIGATION AND DEBRIDEMENT, HEAD AND LINER EXCHANGE (Left) Patient reports pain as moderate.    Objective: Vital signs in last 24 hours: Temp:  [97.7 F (36.5 C)-98.3 F (36.8 C)] 98.3 F (36.8 C) (08/15 0803) Pulse Rate:  [80-102] 88 (08/15 0803) Resp:  [16-18] 18 (08/15 0803) BP: (83-127)/(47-74) 127/74 (08/15 0803) SpO2:  [95 %-100 %] 99 % (08/15 0803)  Intake/Output from previous day: 08/14 0701 - 08/15 0700 In: 1843.4 [P.O.:720; I.V.:393.4; Blood:630] Out: 1300 [Urine:1300] Intake/Output this shift: No intake/output data recorded.  Recent Labs    03/11/19 1425 03/12/19 0320 03/12/19 1615 03/13/19 0605  HGB 10.9* 8.3* 7.7* 8.7*   Recent Labs    03/12/19 1615 03/13/19 0605  WBC 17.5* 19.4*  RBC 2.79* 3.13*  HCT 25.0* 27.7*  PLT 179 213   Recent Labs    03/12/19 0320 03/13/19 0605  NA 131* 135  K 4.6 4.6  CL 103 106  CO2 21* 21*  BUN 28* 34*  CREATININE 2.39* 1.65*  GLUCOSE 257* 165*  CALCIUM 7.8* 8.0*   No results for input(s): LABPT, INR in the last 72 hours.  Left hip incision - C/D/I ,neurovascular intact distally.    Assessment/Plan: 2 Days Post-Op Procedure(s) (LRB): LEFT HIP IRRIGATION AND DEBRIDEMENT, HEAD AND LINER EXCHANGE (Left) Continue antibiotics, Cubicin and rifampin per ID for Staph aureus, final sensitivities are pending. WBC slightly up, will follow Continue PT/OT, WBAT Continue HVAC.  Anemia- improved after transfused 1 unit PRBC to 8.7, follow      Deanie Jupiter, PA-C 03/13/2019, 8:30 AM  [facility] [phone]

## 2019-03-13 NOTE — Progress Notes (Signed)
Physical Therapy Treatment Patient Details Name: Dylan [name] MRN: [id number] DOB: [birth year] Today's Date: 03/13/2019    History of Present Illness Pt is a 58 y/o male s/p removal and exchange of L femoral head and L acetabular polyethylene liner with I&D. PMH including but not limited to HTN and previous L THA on 01/18/19.    PT Comments    Pt making excellent progress with functional mobility. He tolerated further gait training this session without difficulties. PT will continue to follow pt acutely to progress mobility as tolerated per POC.   Follow Up Recommendations  Outpatient PT     Equipment Recommendations  None recommended by PT    Recommendations for Other Services       Precautions / Restrictions Precautions Precautions: None Restrictions Weight Bearing Restrictions: Yes LLE Weight Bearing: Weight bearing as tolerated    Mobility  Bed Mobility Overal bed mobility: Needs Assistance Bed Mobility: Supine to Sit     Supine to sit: Supervision     General bed mobility comments: for safety  Transfers Overall transfer level: Needs assistance Equipment used: Rolling walker (2 wheeled) Transfers: Sit to/from Stand Sit to Stand: Supervision         General transfer comment: supervision for safety, no instability or LOB with transition  Ambulation/Gait Ambulation/Gait assistance: Supervision Gait Distance (Feet): 500 Feet Assistive device: Rolling walker (2 wheeled) Gait Pattern/deviations: Step-through pattern;Decreased stride length Gait velocity: able to fluctuate   General Gait Details: pt generally steady overall with RW, no LOB or need for physical assistance; pt able to increase gait speed as well   Stairs             Wheelchair Mobility    Modified Rankin (Stroke Patients Only)       Balance Overall balance assessment: Needs assistance Sitting-balance support: Feet supported Sitting balance-Leahy Scale: Good     Standing  balance support: During functional activity Standing balance-Leahy Scale: Fair                              Cognition Arousal/Alertness: Awake/alert Behavior During Therapy: WFL for tasks assessed/performed Overall Cognitive Status: Within Functional Limits for tasks assessed                                        Exercises Total Joint Exercises Marching in Standing: AROM;Strengthening;Both;10 reps;Standing General Exercises - Lower Extremity Mini-Sqauts: AROM;Strengthening;10 reps;Standing    General Comments        Pertinent Vitals/Pain Pain Assessment: 0-10 Pain Score: 5  Pain Location: L hip Pain Descriptors / Indicators: Sore Pain Intervention(s): Monitored during session;Repositioned    Home Living                      Prior Function            PT Goals (current goals can now be found in the care plan section) Acute Rehab PT Goals PT Goal Formulation: With patient Time For Goal Achievement: 03/26/19 Potential to Achieve Goals: Good Progress towards PT goals: Progressing toward goals    Frequency    7X/week      PT Plan Discharge plan needs to be updated    Co-evaluation              AM-PAC PT "6 Clicks" Mobility   Outcome Measure  Help  needed turning from your back to your side while in a flat bed without using bedrails?: None Help needed moving from lying on your back to sitting on the side of a flat bed without using bedrails?: None Help needed moving to and from a bed to a chair (including a wheelchair)?: None Help needed standing up from a chair using your arms (e.g., wheelchair or bedside chair)?: None Help needed to walk in hospital room?: None Help needed climbing 3-5 steps with a railing? : A Little 6 Click Score: 23    End of Session Equipment Utilized During Treatment: Gait belt Activity Tolerance: Patient tolerated treatment well Patient left: in chair;with call bell/phone within  reach Nurse Communication: Mobility status PT Visit Diagnosis: Other abnormalities of gait and mobility (R26.89)     Time: 4098-11910851-0907 PT Time Calculation (min) (ACUTE ONLY): 16 min  Charges:  $Gait Training: 8-22 mins                     Deborah ChalkJennifer Shamika [place]regon, [place], DPT  Acute Rehabilitation Services Pager [phone] Office [phone]     Dylan BevelsJennifer M Anaysha Andre 03/13/2019, 9:53 AM

## 2019-03-13 NOTE — Plan of Care (Signed)
  Problem: Education: Goal: Knowledge of General Education information will improve Description: Including pain rating scale, medication(s)/side effects and non-pharmacologic comfort measures Outcome: Progressing   Problem: Health Behavior/Discharge Planning: Goal: Ability to manage health-related needs will improve Outcome: Progressing   Problem: Clinical Measurements: Goal: Ability to maintain clinical measurements within normal limits will improve Outcome: Progressing Goal: Respiratory complications will improve Outcome: Progressing   Problem: Activity: Goal: Risk for activity intolerance will decrease Outcome: Progressing   Problem: Nutrition: Goal: Adequate nutrition will be maintained Outcome: Progressing   Problem: Coping: Goal: Level of anxiety will decrease Outcome: Progressing   Problem: Elimination: Goal: Will not experience complications related to urinary retention Outcome: Progressing   Problem: Pain Managment: Goal: General experience of comfort will improve Outcome: Progressing   Problem: Safety: Goal: Ability to remain free from injury will improve Outcome: Progressing   Problem: Skin Integrity: Goal: Risk for impaired skin integrity will decrease Outcome: Progressing   

## 2019-03-14 ENCOUNTER — Encounter (HOSPITAL_COMMUNITY): Payer: Self-pay | Admitting: *Deleted

## 2019-03-14 LAB — CBC
HCT: 29.7 % — ABNORMAL LOW (ref 39.0–52.0)
Hemoglobin: 9.4 g/dL — ABNORMAL LOW (ref 13.0–17.0)
MCH: 28.2 pg (ref 26.0–34.0)
MCHC: 31.6 g/dL (ref 30.0–36.0)
MCV: 89.2 fL (ref 80.0–100.0)
Platelets: 294 10*3/uL (ref 150–400)
RBC: 3.33 MIL/uL — ABNORMAL LOW (ref 4.22–5.81)
RDW: 13.4 % (ref 11.5–15.5)
WBC: 15.8 10*3/uL — ABNORMAL HIGH (ref 4.0–10.5)
nRBC: 0 % (ref 0.0–0.2)

## 2019-03-14 NOTE — Progress Notes (Signed)
No callback from on call Dr from @ [phone].

## 2019-03-14 NOTE — Progress Notes (Signed)
Patient ID: Dylan [name], male   DOB: May 30, 1961, 58 y.o.   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  Cultures should be finalized by tomorrow.  Anticipate discharge tomorrow morning.  Examination the incisions they are clean and dry there is no wound dehiscence no cellulitis no drainage no signs of infection.

## 2019-03-14 NOTE — Progress Notes (Signed)
Contacted [facility] for on call Dr at [phone] and talked with operator. Pt left hip has been draining a large amount of serosanguineous drainage. Noted that this large amount of drainage only occurs when patient gets up to walk with walker WBAT. Aquacel dressing was reinforced x 2 then replaced x 2 due to large amount of drainage to left hip when pt gets out of bed. Awaiting callback at this time.

## 2019-03-14 NOTE — Progress Notes (Signed)
Subjective:  Increased drainage from his hip wound  Antibiotics:  Anti-infectives (From admission, onward)   Start     Dose/Rate Route Frequency Ordered Stop   03/13/19 1400  ceFAZolin (ANCEF) IVPB 2g/100 mL premix     2 g 200 mL/hr over 30 Minutes Intravenous Every 8 hours 03/13/19 1148     03/13/19 1000  rifampin (RIFADIN) capsule 300 mg     300 mg Oral Every 12 hours 03/12/19 0947     03/12/19 2000  DAPTOmycin (CUBICIN) 750 mg in sodium chloride 0.9 % IVPB  Status:  Discontinued     750 mg 230 mL/hr over 30 Minutes Intravenous Daily 03/12/19 0940 03/13/19 1147   03/12/19 1900  vancomycin (VANCOCIN) 1,250 mg in sodium chloride 0.9 % 250 mL IVPB  Status:  Discontinued     1,250 mg 166.7 mL/hr over 90 Minutes Intravenous Every 24 hours 03/12/19 0915 03/12/19 0940   03/12/19 0900  cefTRIAXone (ROCEPHIN) 2 g in sodium chloride 0.9 % 100 mL IVPB  Status:  Discontinued     2 g 200 mL/hr over 30 Minutes Intravenous Every 24 hours 03/12/19 0824 03/12/19 0940   03/12/19 0200  piperacillin-tazobactam (ZOSYN) IVPB 3.375 g  Status:  Discontinued     3.375 g 12.5 mL/hr over 240 Minutes Intravenous Every 8 hours 03/12/19 0012 03/12/19 0824   03/12/19 0200  vancomycin (VANCOCIN) IVPB 1000 mg/200 mL premix  Status:  Discontinued     1,000 mg 200 mL/hr over 60 Minutes Intravenous Every 8 hours 03/12/19 0012 03/12/19 0900   03/11/19 1928  vancomycin (VANCOCIN) powder  Status:  Discontinued       As needed 03/11/19 1928 03/11/19 2130   03/11/19 1915  vancomycin (VANCOCIN) 500 mg in sodium chloride 0.9 % 100 mL IVPB  Status:  Discontinued     500 mg 100 mL/hr over 60 Minutes Intravenous To Surgery 03/11/19 1901 03/11/19 1903   03/11/19 1915  piperacillin-tazobactam (ZOSYN) IVPB 3.375 g     3.375 g 100 mL/hr over 30 Minutes Intravenous To Surgery 03/11/19 1901 03/11/19 1920   03/11/19 1915  vancomycin (VANCOCIN) IVPB 500 mg/100 ml premix  Status:  Discontinued     500 mg 100 mL/hr  over 60 Minutes Intravenous To Surgery 03/11/19 1903 03/11/19 2345      Medications: Scheduled Meds: . sodium chloride   Intravenous Once  . amLODipine  10 mg Oral QPM  . aspirin  81 mg Oral BID  . Chlorhexidine Gluconate Cloth  6 each Topical Daily  . docusate sodium  100 mg Oral BID  . DULoxetine  30 mg Oral QPM  . gabapentin  800 mg Oral QID  . mupirocin ointment  1 application Nasal BID  . oxyCODONE  10 mg Oral Q12H  . pravastatin  40 mg Oral Daily  . rifampin  300 mg Oral Q12H   Continuous Infusions: . sodium chloride Stopped (03/13/19 1246)  .  ceFAZolin (ANCEF) IV 2 g (03/14/19 0507)  . methocarbamol (ROBAXIN) IV     PRN Meds:.acetaminophen, alum & mag hydroxide-simeth, diphenhydrAMINE, HYDROcodone-acetaminophen, HYDROmorphone (DILAUDID) injection, magnesium citrate, menthol-cetylpyridinium **OR** phenol, methocarbamol **OR** methocarbamol (ROBAXIN) IV, metoCLOPramide **OR** metoCLOPramide (REGLAN) injection, ondansetron **OR** ondansetron (ZOFRAN) IV, oxyCODONE, oxyCODONE, polyethylene glycol, sodium chloride flush, sorbitol, sorbitol    Objective: Weight change:   Intake/Output Summary (Last 24 hours) at 03/14/2019 1258 Last data filed at 03/14/2019 1200 Gross per 24 hour  Intake 1210.68 ml  Output 300 ml  Net 910.68 ml   Blood pressure 132/80, pulse 79, temperature 98.3 F (36.8 C), temperature source Oral, resp. rate 18, height 6' (1.829 m), weight 113.4 kg, SpO2 99 %. Temp:  [97.4 F (36.3 C)-98.5 F (36.9 C)] 98.3 F (36.8 C) (08/16 0900) Pulse Rate:  [79-91] 79 (08/16 0900) BP: (128-134)/(70-81) 132/80 (08/16 0900) SpO2:  [99 %-100 %] 99 % (08/16 0900)  Physical Exam: General: Alert and awake, oriented x3, not in any acute distress. HEENT: anicteric sclera, EOMI CVS regular rate, normal  Chest: , no wheezing, no respiratory distress Abdomen: soft non-distended,  Extremities: no edema his hip has bandage on it Skin: no rashes Neuro: nonfocal  CBC:     BMET Recent Labs    03/12/19 0320 03/13/19 0605  NA 131* 135  K 4.6 4.6  CL 103 106  CO2 21* 21*  GLUCOSE 257* 165*  BUN 28* 34*  CREATININE 2.39* 1.65*  CALCIUM 7.8* 8.0*     Liver Panel  No results for input(s): PROT, ALBUMIN, AST, ALT, ALKPHOS, BILITOT, BILIDIR, IBILI in the last 72 hours.     Sedimentation Rate Recent Labs    03/13/19 0605  ESRSEDRATE 45*   C-Reactive Protein Recent Labs    03/11/19 1425 03/13/19 0605  CRP 28.1* 18.9*    Micro Results: Recent Results (from the past 720 hour(s))  Anaerobic and Aerobic Culture     Status: Abnormal (Preliminary result)   Collection Time: 03/11/19  9:11 AM   Specimen: Joint, Hip; Synovial Fluid  Result Value Ref Range Status   MICRO NUMBER: [id number]  Preliminary   SPECIMEN QUALITY: Adequate  Preliminary   Source: SYNOVIAL FLUID  Preliminary   STATUS: PRELIMINARY  Preliminary   GRAM STAIN: (AA)  Preliminary    Many Polymorphonuclear leukocytes Moderate Gram positive cocci in clusters   ANA RESULT:   Preliminary    No anaerobes isolated to date, continuing incubation.   MICRO NUMBER: [id number]  Final   SPECIMEN QUALITY: Adequate  Final   SOURCE: SYNOVIAL FLUID  Final   STATUS: FINAL  Final   AER ISOLATE 1: Staphylococcus aureus (A)  Final    Comment: Heavy growth of Staphylococcus aureus This isolate demonstrates inducible clindamycin resistance.      Susceptibility   Staphylococcus aureus - AEROBIC BACTERIA, CULT POSITIVE 1    VANCOMYCIN <=0.5 Sensitive     CIPROFLOXACIN <=0.5 Sensitive     CLINDAMYCIN NR Resistant     LEVOFLOXACIN 0.25 Sensitive     ERYTHROMYCIN >=8 Resistant     GENTAMICIN <=0.5 Sensitive     OXACILLIN* <=0.25 Sensitive      * Oxacillin-susceptible staphylococci aresusceptible to other penicillinase-stablepenicillins (e.g. Methicillin, Nafcillin), beta-lactam/beta-lactamase inhibitor combinations, andcephems with staphylococcal indications, includingCefazolin.    TETRACYCLINE  <=1 Sensitive     TRIMETH/SULFA* <=10 Sensitive      * Oxacillin-susceptible staphylococci aresusceptible to other penicillinase-stablepenicillins (e.g. Methicillin, Nafcillin), beta-lactam/beta-lactamase inhibitor combinations, andcephems with staphylococcal indications, includingCefazolin.Legend:S = Susceptible  I = IntermediateR = Resistant  NS = Not susceptible* = Not tested  NR = Not reported**NN = See antimicrobic comments  SARS Coronavirus 2 [facility](Hospital order, Performed in [facility] hospital lab) Nasopharyngeal Nasopharyngeal Swab     Status: None   Collection Time: 03/11/19 10:59 AM   Specimen: Nasopharyngeal Swab  Result Value Ref Range Status   SARS Coronavirus 2 NEGATIVE NEGATIVE Final    Comment: (NOTE) If result is NEGATIVE SARS-CoV-2 target nucleic acids are NOT DETECTED. The SARS-CoV-2 RNA is generally detectable in  upper and lower  respiratory specimens during the acute phase of infection. The lowest  concentration of SARS-CoV-2 viral copies this assay can detect is 250  copies / mL. A negative result does not preclude SARS-CoV-2 infection  and should not be used as the sole basis for treatment or other  patient management decisions.  A negative result may occur with  improper specimen collection / handling, submission of specimen other  than nasopharyngeal swab, presence of viral mutation(s) within the  areas targeted by this assay, and inadequate number of viral copies  (<250 copies / mL). A negative result must be combined with clinical  observations, patient history, and epidemiological information. If result is POSITIVE SARS-CoV-2 target nucleic acids are DETECTED. The SARS-CoV-2 RNA is generally detectable in upper and lower  respiratory specimens dur ing the acute phase of infection.  Positive  results are indicative of active infection with SARS-CoV-2.  Clinical  correlation with patient history and other diagnostic information is  necessary to determine patient  infection status.  Positive results do  not rule out bacterial infection or co-infection with other viruses. If result is PRESUMPTIVE POSTIVE SARS-CoV-2 nucleic acids MAY BE PRESENT.   A presumptive positive result was obtained on the submitted specimen  and confirmed on repeat testing.  While 2019 novel coronavirus  (SARS-CoV-2) nucleic acids may be present in the submitted sample  additional confirmatory testing may be necessary for epidemiological  and / or clinical management purposes  to differentiate between  SARS-CoV-2 and other Sarbecovirus currently known to infect humans.  If clinically indicated additional testing with an alternate test  methodology [phone]) is advised. The SARS-CoV-2 RNA is generally  detectable in upper and lower respiratory sp ecimens during the acute  phase of infection. The expected result is Negative. Fact Sheet for Patients:  BoilerBrush.com.cyhttps://www.fda.gov/media/136312/download Fact Sheet for Healthcare Providers: https://pope.com/https://www.fda.gov/media/136313/download This test is not yet approved or cleared by the [place] FDA and has been authorized for detection and/or diagnosis of SARS-CoV-2 by FDA under an Emergency Use Authorization (EUA).  This EUA will remain in effect (meaning this test can be used) for the duration of the COVID-19 declaration under Section 564(b)(1) of the Act, 21 U.S.C. section 360bbb-3(b)(1), unless the authorization is terminated or revoked sooner. Performed at [facility], [address] [place], [place] [address]   Surgical pcr screen     Status: Abnormal   Collection Time: 03/11/19 12:24 PM   Specimen: Nasal Mucosa; Nasal Swab  Result Value Ref Range Status   MRSA, PCR NEGATIVE NEGATIVE Final   Staphylococcus aureus POSITIVE (A) NEGATIVE Final    Comment: (NOTE) The Xpert SA Assay (FDA approved for NASAL specimens in patients 60 years of age and older), is one component of a comprehensive surveillance  program. It is not intended to diagnose infection nor to guide or monitor treatment. Performed at [facility], [address] [place], [place] [address]   Aerobic/Anaerobic Culture (surgical/deep wound)     Status: None (Preliminary result)   Collection Time: 03/11/19  7:19 PM   Specimen: Abscess  Result Value Ref Range Status   Specimen Description ABSCESS LEFT HIP  Final   Special Requests NONE  Final   Gram Stain   Final    FEW WBC PRESENT,BOTH PMN AND MONONUCLEAR RARE GRAM POSITIVE COCCI Performed at [facility], [address] [place], [place] [address]    Culture   Final    MODERATE STAPHYLOCOCCUS AUREUS NO ANAEROBES  ISOLATED; CULTURE IN PROGRESS FOR 5 DAYS    Report Status PENDING  Incomplete   Organism ID, Bacteria STAPHYLOCOCCUS AUREUS  Final      Susceptibility   Staphylococcus aureus - MIC*    CIPROFLOXACIN <=0.5 SENSITIVE Sensitive     ERYTHROMYCIN RESISTANT Resistant     GENTAMICIN <=0.5 SENSITIVE Sensitive     OXACILLIN <=0.25 SENSITIVE Sensitive     TETRACYCLINE <=1 SENSITIVE Sensitive     VANCOMYCIN 1 SENSITIVE Sensitive     TRIMETH/SULFA <=10 SENSITIVE Sensitive     CLINDAMYCIN RESISTANT Resistant     RIFAMPIN <=0.5 SENSITIVE Sensitive     Inducible Clindamycin POSITIVE Resistant     * MODERATE STAPHYLOCOCCUS AUREUS    Studies/Results: No results found.    Assessment/Plan:  INTERVAL HISTORY: MSSA isolated  Principal Problem:   Prosthetic joint infection of left hip (HCC) Active Problems:   Status post total replacement of left hip    Dylan [name] is a 58 y.o. male with prosthetic hip infection status post I&D, single staged exchange arthroplasty with methicillin sensitive Staphylococcus aureus growing on culture.  1.  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  After that I will switch him to oral Keflex plus rifampin and treat him for a minimum of 6 months.  Please see notes yesterday's note for the details of his antibiotic stop date.  He has a follow-up ointment with me as well in my clinic which is documented in yesterday's note.  I will sign off for now please call with further questions.     LOS: 3 days   Acey LavCornelius Van Dam 03/14/2019, 12:58 PM

## 2019-03-14 NOTE — Progress Notes (Addendum)
Received message from [facility] at [facility] - following for IV antibiotics at home.   Patient has used [facility] recently. Faxed information to [phone]. Referral pending. Will need HH orders for RN with [facility].   Manya Silvas, RN CM [facility] [phone]

## 2019-03-15 LAB — BPAM RBC
Blood Product Expiration Date: 202008202359
Blood Product Expiration Date: 202008222359
Blood Product Expiration Date: 202008242359
Blood Product Expiration Date: 202008252359
ISSUE DATE / TIME: 202008131722
ISSUE DATE / TIME: 202008142056
Unit Type and Rh: 9500
Unit Type and Rh: 9500
Unit Type and Rh: 9500
Unit Type and Rh: 9500

## 2019-03-15 LAB — TYPE AND SCREEN
ABO/RH(D): O NEG
Antibody Screen: NEGATIVE
Unit division: 0
Unit division: 0
Unit division: 0
Unit division: 0

## 2019-03-15 LAB — HCV COMMENT:

## 2019-03-15 LAB — HEPATITIS C ANTIBODY (REFLEX): HCV Ab: 0.1 s/co ratio (ref 0.0–0.9)

## 2019-03-15 LAB — HEPATITIS B SURFACE ANTIGEN: Hepatitis B Surface Ag: NEGATIVE

## 2019-03-15 MED ORDER — ASPIRIN EC 81 MG PO TBEC: 81.0000 mg | DELAYED_RELEASE_TABLET | Freq: Two times a day (BID) | ORAL | 0 refills | Status: DC

## 2019-03-15 MED ORDER — METHOCARBAMOL 750 MG PO TABS: 750.0000 mg | ORAL_TABLET | Freq: Two times a day (BID) | ORAL | 0 refills | Status: DC | PRN

## 2019-03-15 MED ORDER — OXYCODONE-ACETAMINOPHEN 5-325 MG PO TABS: 1.0000 | ORAL_TABLET | Freq: Three times a day (TID) | ORAL | 0 refills | Status: DC | PRN

## 2019-03-15 MED ORDER — SENNOSIDES-DOCUSATE SODIUM 8.6-50 MG PO TABS: 1.0000 | ORAL_TABLET | Freq: Every evening | ORAL | 1 refills | Status: DC | PRN

## 2019-03-15 MED ORDER — CEFAZOLIN IV (FOR PTA / DISCHARGE USE ONLY): 2.0000 g | Freq: Three times a day (TID) | INTRAVENOUS | 0 refills | Status: AC

## 2019-03-15 NOTE — Plan of Care (Signed)

## 2019-03-15 NOTE — Care Management Important Message (Signed)
Important Message  Patient Details  Name: Dylan [name] MRN: [id number] Date of Birth: May 10, 1961   Medicare Important Message Given:  Yes     Orbie Pyo 03/15/2019, 2:55 PM

## 2019-03-15 NOTE — TOC Transition Note (Signed)
Transition of Care [facility]) - CM/SW Discharge Note   Patient Details  Name: Dylan [name] MRN: [id number] Date of Birth: January 07, 1961  [facility]) CM/SW Contact:  Bartholomew Crews, RN Phone Number: [phone] 03/15/2019, 10:58 AM   Clinical Narrative:    Patient to transition home today. Received call from [place] at [facility] in response to referral faxed late yesterday. Referral accepted with start date for Wednesday 8/19. [facility] to provide outpatient antibiotics - spoke with Pam at [facility] who will do teaching with patient and spouse prior to discharge today. Patient will need [facility] RN order with [facility]. CM following for transition of care needs.    Final next level of care: [place] Barriers to Discharge: No Barriers Identified   Patient Goals and CMS Choice Patient states their goals for this hospitalization and ongoing recovery are:: return home CMS Medicare.gov Compare Post Acute Care list provided to:: Patient Choice offered to / list presented to : Patient  Discharge Placement                       Discharge Plan and Services                DME Arranged: IV pump/equipment         HH Arranged: RN [place] Agency: [place] Date [place]: 03/15/19 Time [place]: 6834 Representative spoke with at [place]: [place] at [employer]; Myriam Jacobson at [place] Interventions     Readmission Risk Interventions No flowsheet data found.

## 2019-03-15 NOTE — Progress Notes (Signed)
   Subjective:  Patient reports pain as mild.  Objective:   VITALS:   Vitals:   03/14/19 0900 03/14/19 1700 03/14/19 1919 03/15/19 0440  BP: 132/80 130/82 (!) 141/82 (!) 143/86  Pulse: 79 78 84 84  Resp:      Temp: 98.3 F (36.8 C) 98.3 F (36.8 C) 98.2 F (36.8 C) 98.4 F (36.9 C)  TempSrc: Oral Oral Oral Oral  SpO2: 99% 99% 100% 98%  Weight:      Height:        Incision c/d/i - no more drainage   Lab Results  Component Value Date   WBC 15.8 (H) 03/14/2019   HGB 9.4 (L) 03/14/2019   HCT 29.7 (L) 03/14/2019   MCV 89.2 03/14/2019   PLT 294 03/14/2019     Assessment/Plan:  4 Days Post-Op   - doing well this morning, ready to go home - will discharge home with IV ancef x 6 weeks - [facility] RN face to face entered - abx rx in chart, rest has been sent in to pharmacy - bandages changed this morning  Eduard Roux 03/15/2019, 7:16 AM [phone]

## 2019-03-15 NOTE — TOC Transition Note (Signed)
Transition of Care [facility]) - CM/SW Discharge Note   Patient Details  Name: Dylan [name] MRN: [id number] Date of Birth: [birth year]  Transition of Care [facility]) CM/SW Contact:  Bartholomew Crews, RN Phone Number: [phone] 03/15/2019, 11:41 AM   Clinical Narrative:    Spoke with patient at the bedside to discuss transition plans. Advised of [facility] to start care on Wednesday. [facility] to do administration teaching for IV antibiotics prior to d/c - patient states his wife was an LPN and is a good support person. No further transition of care needs identified at this time.    Final next level of care: [place] Barriers to Discharge: No Barriers Identified   Patient Goals and CMS Choice Patient states their goals for this hospitalization and ongoing recovery are:: return home CMS Medicare.gov Compare Post Acute Care list provided to:: Patient Choice offered to / list presented to : Patient  Discharge Placement                       Discharge Plan and Services                DME Arranged: IV pump/equipment         HH Arranged: RN [place] Agency: [place] Date [place]: 03/15/19 Time [place]: 3419 Representative spoke with at [place]: [place] at [employer]; Myriam Jacobson at [place] Interventions     Readmission Risk Interventions No flowsheet data found.

## 2019-03-16 NOTE — Discharge Summary (Signed)
Physician Discharge Summary      Patient ID: Dylan [name] MRN: [id number] DOB/AGE: [birth year] 58 y.o.  Admit date: 03/11/2019 Discharge date: 03/16/2019  Admission Diagnoses:  Prosthetic joint infection of left hip [facility])  Discharge Diagnoses:  Principal Problem:   Prosthetic joint infection of left hip ([place]) Active Problems:   Status post total replacement of left hip   Past Medical History:  Diagnosis Date  . Anxiety   . Arthritis   . Depression   . History of kidney stones   . Hypertension   . Neck pain   . Sleep apnea    to start CPAP after surgery    Surgeries: Procedure(s): LEFT HIP IRRIGATION AND DEBRIDEMENT, HEAD AND LINER EXCHANGE on 03/11/2019   Consultants (if any):   Discharged Condition: Improved  Hospital Course: Dylan [name] is an 58 y.o. male who was admitted 03/11/2019 with a diagnosis of Prosthetic joint infection of left hip ([place]) and went to the operating room on 03/11/2019 and underwent the above named procedures.    He was given perioperative antibiotics:  Anti-infectives (From admission, onward)   Start     Dose/Rate Route Frequency Ordered Stop   03/15/19 0000  ceFAZolin (ANCEF) IVPB     2 g Intravenous Every 8 hours 03/15/19 0713 04/23/19 2359   03/13/19 1400  ceFAZolin (ANCEF) IVPB 2g/100 mL premix  Status:  Discontinued     2 g 200 mL/hr over 30 Minutes Intravenous Every 8 hours 03/13/19 1148 03/15/19 1606   03/13/19 1000  rifampin (RIFADIN) capsule 300 mg  Status:  Discontinued     300 mg Oral Every 12 hours 03/12/19 0947 03/15/19 1606   03/12/19 2000  DAPTOmycin (CUBICIN) 750 mg in sodium chloride 0.9 % IVPB  Status:  Discontinued     750 mg 230 mL/hr over 30 Minutes Intravenous Daily 03/12/19 0940 03/13/19 1147   03/12/19 1900  vancomycin (VANCOCIN) 1,250 mg in sodium chloride 0.9 % 250 mL IVPB  Status:  Discontinued     1,250 mg 166.7 mL/hr over 90 Minutes Intravenous Every 24 hours 03/12/19 0915 03/12/19 0940   03/12/19 0900   cefTRIAXone (ROCEPHIN) 2 g in sodium chloride 0.9 % 100 mL IVPB  Status:  Discontinued     2 g 200 mL/hr over 30 Minutes Intravenous Every 24 hours 03/12/19 0824 03/12/19 0940   03/12/19 0200  piperacillin-tazobactam (ZOSYN) IVPB 3.375 g  Status:  Discontinued     3.375 g 12.5 mL/hr over 240 Minutes Intravenous Every 8 hours 03/12/19 0012 03/12/19 0824   03/12/19 0200  vancomycin (VANCOCIN) IVPB 1000 mg/200 mL premix  Status:  Discontinued     1,000 mg 200 mL/hr over 60 Minutes Intravenous Every 8 hours 03/12/19 0012 03/12/19 0900   03/11/19 1928  vancomycin (VANCOCIN) powder  Status:  Discontinued       As needed 03/11/19 1928 03/11/19 2130   03/11/19 1915  vancomycin (VANCOCIN) 500 mg in sodium chloride 0.9 % 100 mL IVPB  Status:  Discontinued     500 mg 100 mL/hr over 60 Minutes Intravenous To Surgery 03/11/19 1901 03/11/19 1903   03/11/19 1915  piperacillin-tazobactam (ZOSYN) IVPB 3.375 g     3.375 g 100 mL/hr over 30 Minutes Intravenous To Surgery 03/11/19 1901 03/11/19 1920   03/11/19 1915  vancomycin (VANCOCIN) IVPB 500 mg/100 ml premix  Status:  Discontinued     500 mg 100 mL/hr over 60 Minutes Intravenous To Surgery 03/11/19 1903 03/11/19 2345    .  He was given sequential compression devices, early ambulation, and appropriate chemoprophylaxis for DVT prophylaxis.  He benefited maximally from the hospital stay and there were no complications.    Recent vital signs:  Vitals:   03/14/19 1919 03/15/19 0440  BP: (!) 141/82 (!) 143/86  Pulse: 84 84  Resp:    Temp: 98.2 F (36.8 C) 98.4 F (36.9 C)  SpO2: 100% 98%    Recent laboratory studies:  Lab Results  Component Value Date   HGB 9.4 (L) 03/14/2019   HGB 8.7 (L) 03/13/2019   HGB 7.7 (L) 03/12/2019   Lab Results  Component Value Date   WBC 15.8 (H) 03/14/2019   PLT 294 03/14/2019   Lab Results  Component Value Date   INR 1.0 01/14/2019   Lab Results  Component Value Date   NA 135 03/13/2019   K 4.6  03/13/2019   CL 106 03/13/2019   CO2 21 (L) 03/13/2019   BUN 34 (H) 03/13/2019   CREATININE 1.65 (H) 03/13/2019   GLUCOSE 165 (H) 03/13/2019    Discharge Medications:   Allergies as of 03/15/2019   No Known Allergies     Medication List    TAKE these medications   amLODipine 10 MG tablet Commonly known as: NORVASC Take 10 mg by mouth every evening.   aspirin EC 81 MG tablet Take 1 tablet (81 mg total) by mouth 2 (two) times daily. What changed: Another medication with the same name was added. Make sure you understand how and when to take each.   aspirin EC 81 MG tablet Take 1 tablet (81 mg total) by mouth 2 (two) times daily. What changed: You were already taking a medication with the same name, and this prescription was added. Make sure you understand how and when to take each.   ceFAZolin  IVPB Commonly known as: ANCEF Inject 2 g into the vein every 8 (eight) hours. Indication:  Prosthetic Joint Infection Last Day of Therapy:  04/21/2019 Labs - Once weekly:  CBC/D and BMP, Labs - Every other week:  ESR and CRP   DULoxetine 30 MG capsule Commonly known as: CYMBALTA Take 30 mg by mouth every evening.   gabapentin 800 MG tablet Commonly known as: NEURONTIN Take 800 mg by mouth 4 (four) times daily.   lisinopril 20 MG tablet Commonly known as: ZESTRIL Take 20 mg by mouth daily.   methocarbamol 750 MG tablet Commonly known as: ROBAXIN Take 1 tablet (750 mg total) by mouth 2 (two) times daily as needed for muscle spasms.   oxyCODONE-acetaminophen 5-325 MG tablet Commonly known as: Percocet Take 1-2 tablets by mouth every 8 (eight) hours as needed for severe pain.   pravastatin 40 MG tablet Commonly known as: PRAVACHOL Take 40 mg by mouth daily.   senna-docusate 8.6-50 MG tablet Commonly known as: Senokot S Take 1-2 tablets by mouth at bedtime as needed.   sodium chloride 0.65 % Soln nasal spray Commonly known as: OCEAN Place 1 spray into both nostrils as  needed for congestion.            Home Infusion Instuctions  (From admission, onward)         Start     Ordered   03/15/19 0000  Home infusion instructions [facility] May follow [place] Dosing Protocol; May administer Cathflo as needed to maintain patency of vascular access device.; Flushing of vascular access device: per [facility] Protocol: 0.9% NaCl pre/post medica...    Question Answer Comment  Instructions May follow [place] Dosing Protocol   Instructions May administer Cathflo as needed to maintain patency of vascular access device.   Instructions Flushing of vascular access device: per [facility] Protocol: 0.9% NaCl pre/post medication administration and prn patency; Heparin 100 u/ml, 46m for implanted ports and Heparin 10u/ml, 553mfor all other central venous catheters.   Instructions May follow [facility] Anaphylaxis Protocol for First Dose Administration in the home: 0.9% NaCl at 25-50 ml/hr to maintain IV access for protocol meds. Epinephrine 0.3 ml IV/IM PRN and Benadryl 25-50 IV/IM PRN s/s of anaphylaxis.   Instructions [facility] Infusion Coordinator (RN) to assist per patient IV care needs in the home PRN.      03/15/19 0713          Diagnostic Studies: Dg Pelvis Portable  Result Date: 03/11/2019 CLINICAL DATA:  Post left hip arthroplasty EXAM: PORTABLE PELVIS 1-2 VIEWS COMPARISON:  Same day fluoroscopy FINDINGS: Postsurgical changes from left hip arthroplasty with appropriate alignment of the femoral and acetabular components. Images also demonstrate moderate osteoarthrosis of the right femur surgical drain projects over the pelvis. Soft tissues are otherwise unremarkable. IMPRESSION: Postsurgical changes from left hip arthroplasty without evidence of acute postoperative complication. Electronically Signed   By: PrLovena Le.D.   On: 03/11/2019 22:57   Dg C-arm 1-60 Min  Result Date: 03/11/2019 CLINICAL DATA:  Left hip irrigation and debridement. Head and liner  exchange. EXAM: DG C-ARM 61-120 MIN; OPERATIVE LEFT HIP WITH PELVIS COMPARISON:  Preoperative radiograph 03/05/2019 FINDINGS: Single fluoroscopic spot image obtained in the operating room demonstrates left hip arthroplasty. Total fluoroscopy time 0.2 seconds. IMPRESSION: Single fluoroscopic spot view demonstrating left hip arthroplasty. Electronically Signed   By: MeKeith Rake.D.   On: 03/11/2019 20:53   Dg Hip Operative Unilat W Or W/o Pelvis Left  Result Date: 03/11/2019 CLINICAL DATA:  Left hip irrigation and debridement. Head and liner exchange. EXAM: DG C-ARM 61-120 MIN; OPERATIVE LEFT HIP WITH PELVIS COMPARISON:  Preoperative radiograph 03/05/2019 FINDINGS: Single fluoroscopic spot image obtained in the operating room demonstrates left hip arthroplasty. Total fluoroscopy time 0.2 seconds. IMPRESSION: Single fluoroscopic spot view demonstrating left hip arthroplasty. Electronically Signed   By: MeKeith Rake.D.   On: 03/11/2019 20:53   Xr Hip Unilat W Or W/o Pelvis 2-3 Views Left  Result Date: 03/05/2019 Well-seated prosthesis without complication  [place] Site Rite  Result Date: 03/12/2019 If Site Rite image not attached, placement could not be confirmed due to current cardiac rhythm.   Disposition:   Discharge Instructions    Home infusion instructions [facility] May follow [facility] Protocol; May administer Cathflo as needed to maintain patency of vascular access device.; Flushing of vascular access device: per [facility]: 0.9% NaCl pre/post medica...   Complete by: As directed    Instructions: May follow [facility] [place] Protocol   Instructions: May administer Cathflo as needed to maintain patency of vascular access device.   Instructions: Flushing of vascular access device: per [facility]: 0.9% NaCl pre/post medication administration and prn patency; Heparin 100 u/ml, 62m28mor implanted ports and Heparin 10u/ml, 62ml64mr all other central venous  catheters.   Instructions: May follow [facility] Anaphylaxis Protocol for First Dose Administration in the home: 0.9% NaCl at 25-50 ml/hr to maintain IV access for protocol meds. Epinephrine 0.3 ml IV/IM PRN and Benadryl 25-50 IV/IM PRN s/s of anaphylaxis.   Instructions: [place] Coordinator (RN) to assist per patient IV  care needs in the home PRN.      [facility] Follow up.   Why: [facility] will f/u beginning on Wednesday 8/19 Contact information: [place] [id number] [phone]        [facility] Follow up.   Why: providing IV antibiotics           Signed: Eduard Roux 03/16/2019, 9:17 PM

## 2019-03-17 ENCOUNTER — Telehealth: Payer: Self-pay | Admitting: Radiology

## 2019-03-17 ENCOUNTER — Telehealth: Payer: Self-pay

## 2019-03-17 ENCOUNTER — Encounter: Payer: Self-pay | Admitting: Infectious Disease

## 2019-03-17 LAB — SYNOVIAL CELL COUNT + DIFF, W/ CRYSTALS
Basophils, %: 0 %
Eosinophils-Synovial: 1 % (ref 0–2)
Lymphocytes-Synovial Fld: 14 % (ref 0–74)
Monocyte/Macrophage: 28 % (ref 0–69)
Neutrophil, Synovial: 57 % — ABNORMAL HIGH (ref 0–24)
Synoviocytes, %: 0 % (ref 0–15)
WBC, Synovial: 53520 cells/uL — ABNORMAL HIGH (ref <150)

## 2019-03-17 LAB — ANAEROBIC AND AEROBIC CULTURE
MICRO NUMBER:: 768636
MICRO NUMBER:: 768637
SPECIMEN QUALITY:: ADEQUATE
SPECIMEN QUALITY:: ADEQUATE

## 2019-03-17 LAB — AEROBIC/ANAEROBIC CULTURE W GRAM STAIN (SURGICAL/DEEP WOUND)

## 2019-03-17 NOTE — Telephone Encounter (Signed)
Chrissie Noa from [employer] called stated family called and dressing is saturated and leaking.  Dr. Erlinda Hong approved for on call nurse to go out and changed dressing.  VO betadine 2x daily with dry dressing, ABD pad and paper tape. Patient can be shown how to change dressing as well.

## 2019-03-17 NOTE — Telephone Encounter (Signed)
Critical lab results were called by [employer], Mickie.  Aerobic culture on synovial fluid shows heavy growth of staphylococcus aureus. This is inducible to clindamycin resistance.  She will fax STAT results to office.

## 2019-03-17 NOTE — Telephone Encounter (Signed)
FYI-  [facility] called stating that Gram positive Cocci in Cluster.  Stated that a copy will be faxed to our office.

## 2019-03-17 NOTE — Telephone Encounter (Signed)
Thanks

## 2019-03-24 ENCOUNTER — Encounter: Payer: Self-pay | Admitting: Infectious Disease

## 2019-03-31 ENCOUNTER — Other Ambulatory Visit: Payer: Self-pay

## 2019-03-31 ENCOUNTER — Telehealth: Payer: Self-pay | Admitting: Radiology

## 2019-03-31 ENCOUNTER — Emergency Department (HOSPITAL_COMMUNITY)
Admission: EM | Admit: 2019-03-31 | Discharge: 2019-03-31 | Disposition: A | Discharge status: Home / Self Care | Payer: Medicare PPO | Attending: Emergency Medicine | Admitting: Emergency Medicine

## 2019-03-31 ENCOUNTER — Emergency Department (HOSPITAL_COMMUNITY): Payer: Medicare PPO

## 2019-03-31 DIAGNOSIS — Z96642 Presence of left artificial hip joint: Secondary | ICD-10-CM | POA: Diagnosis not present

## 2019-03-31 DIAGNOSIS — Y712 Prosthetic and other implants, materials and accessory cardiovascular devices associated with adverse incidents: Secondary | ICD-10-CM | POA: Diagnosis not present

## 2019-03-31 DIAGNOSIS — Z79899 Other long term (current) drug therapy: Secondary | ICD-10-CM | POA: Insufficient documentation

## 2019-03-31 DIAGNOSIS — T849XXA Unspecified complication of internal orthopedic prosthetic device, implant and graft, initial encounter: Secondary | ICD-10-CM

## 2019-03-31 DIAGNOSIS — T8142XA Infection following a procedure, deep incisional surgical site, initial encounter: Secondary | ICD-10-CM | POA: Insufficient documentation

## 2019-03-31 DIAGNOSIS — Z7982 Long term (current) use of aspirin: Secondary | ICD-10-CM | POA: Insufficient documentation

## 2019-03-31 DIAGNOSIS — T80219A Unspecified infection due to central venous catheter, initial encounter: Secondary | ICD-10-CM | POA: Diagnosis present

## 2019-03-31 DIAGNOSIS — Y792 Prosthetic and other implants, materials and accessory orthopedic devices associated with adverse incidents: Secondary | ICD-10-CM | POA: Insufficient documentation

## 2019-03-31 MED ORDER — LIDOCAINE HCL 1 % IJ SOLN
INTRAMUSCULAR | Status: AC
  Filled 2019-03-31: qty 20

## 2019-03-31 MED ORDER — LIDOCAINE HCL (PF) 1 % IJ SOLN
INTRAMUSCULAR | Status: DC | PRN
  Administered 2019-03-31: 5 mL

## 2019-03-31 MED ORDER — HEPARIN SOD (PORK) LOCK FLUSH 100 UNIT/ML IV SOLN
INTRAVENOUS | Status: AC
  Filled 2019-03-31: qty 5

## 2019-03-31 NOTE — Procedures (Signed)
Interventional Radiology Procedure:   Indications: Right arm PICC line infection, needs new PICC for IV antibiotics  Procedure: Left arm PICC line placement  Findings: Left brachial PICC, 44 cm.  Single lumen.  Complications: None     EBL: Minimal, less than 10 ml  Plan: Will remove right arm PICC.  Left arm PICC is ready to use.     Seanmichael Salmons R. Anselm Pancoast, MD  Pager: [phone]

## 2019-03-31 NOTE — Telephone Encounter (Signed)
FYI  Patient's wife called this morning and states that his PICC line will not flush. She has spoken with [facility] who planned to come out for dressing change around 12p today, and they advised she should call physician's office as Dr. Erlinda Hong would probably want to see him and change the PICC line. I explained to patient's wife that Dr. Erlinda Hong is in surgery this morning, and that this is not something we would take care of in the office. I advised patient's wife to take patient to ED to have PICC line evaluated. She expressed understanding and is going to take patient to [facility] ED.

## 2019-03-31 NOTE — ED Triage Notes (Signed)
Pt has a picc line in his right arm that he is using to receive antibiotics after his left hip replacement got infected.  Pt states this morning it would not flush.

## 2019-03-31 NOTE — Telephone Encounter (Signed)
Could you please call patient's wife and advise?

## 2019-03-31 NOTE — Progress Notes (Signed)
Assessed PICC line. Flushed with GBR. Dressing and cap changed, noted redness and slight Billet discharges from PICC site. MD and RN aware.

## 2019-03-31 NOTE — ED Notes (Signed)
RN from IR came down & pt is being transported to [facility]. with her at this time for his PICC replacement.

## 2019-03-31 NOTE — ED Notes (Signed)
IV team at bedside 

## 2019-03-31 NOTE — Discharge Instructions (Addendum)
Get help right away if: You have swelling in the arm in which the PICC was inserted. You have numbness or tingling in your fingers, hand, or arm. You have a red streak going up your arm from where the PICC was inserted. Your PICC cannot be flushed, is hard to flush, or leaks around the insertion site when flushed. You have pain in your arm, ear, face, or teeth. Your PICC is accidentally pulled all the way out. If this happens, cover the insertion site with a bandage or gauze dressing. Do not throw the PICC away. Your health care provider will need to check it. Your PICC was tugged or pulled and has partially come out. Do not push the PICC back in. You hear a "flushing" sound when the PICC is flushed. You feel your heart racing or skipping beats. There is a hole or tear in the PICC. You have pain in your chest. You have shortness of breath or difficulty breathing.

## 2019-03-31 NOTE — ED Provider Notes (Signed)
[facility] EMERGENCY DEPARTMENT Provider Note   CSN: [id number] Arrival date & time: 03/31/19  1016     History   Chief Complaint Chief Complaint  Patient presents with  . picc line not flushing     HPI Dylan [name] is a 58 y.o. male.     Patient presents emergency department.now Needing parenteral antibiotics for prosthetic hip infection.  Patient antibiotics this morning and his PICC line would not flush and came to the emergency department for evaluation.  He does complain of some soreness of the site of the PICC line in his right upper extremity.  He denies fevers or chills.     Past Medical History:  Diagnosis Date  . Anxiety   . Arthritis   . Depression   . History of kidney stones   . Hypertension   . Neck pain   . Sleep apnea    to start CPAP after surgery    Patient Active Problem List   Diagnosis Date Noted  . Prosthetic joint infection of left hip ([place]) 03/11/2019  . Primary osteoarthritis of left hip 01/18/2019  . Status post total replacement of left hip 01/18/2019    Past Surgical History:  Procedure Laterality Date  . ANTERIOR HIP REVISION Left 03/11/2019   Procedure: LEFT HIP IRRIGATION AND DEBRIDEMENT, HEAD AND LINER EXCHANGE;  Surgeon: Leandrew Koyanagi, MD;  Location: [facility];  Service: Orthopedics;  Laterality: Left;  Need RNFA  . HAND SURGERY Left    infected hand  . SHOULDER SURGERY Left    dislocated collor bone  . TOTAL HIP ARTHROPLASTY Left 01/18/2019  . TOTAL HIP ARTHROPLASTY Left 01/18/2019   Procedure: LEFT TOTAL HIP ARTHROPLASTY ANTERIOR APPROACH;  Surgeon: Leandrew Koyanagi, MD;  Location: [facility];  Service: Orthopedics;  Laterality: Left;        Home Medications    Prior to Admission medications   Medication Sig Start Date End Date Taking? Authorizing Provider  amLODipine (NORVASC) 10 MG tablet Take 10 mg by mouth every evening.  11/26/18   [provider]  aspirin EC 81 MG tablet Take 1 tablet (81 mg  total) by mouth 2 (two) times daily. 01/18/19   Leandrew Koyanagi, MD  aspirin EC 81 MG tablet Take 1 tablet (81 mg total) by mouth 2 (two) times daily. 03/15/19   Leandrew Koyanagi, MD  ceFAZolin (ANCEF) IVPB Inject 2 g into the vein every 8 (eight) hours. Indication:  Prosthetic Joint Infection Last Day of Therapy:  04/21/2019 Labs - Once weekly:  CBC/D and BMP, Labs - Every other week:  ESR and CRP 03/15/19 04/23/19  Leandrew Koyanagi, MD  DULoxetine (CYMBALTA) 30 MG capsule Take 30 mg by mouth every evening.  12/28/18   [provider]  gabapentin (NEURONTIN) 800 MG tablet Take 800 mg by mouth 4 (four) times daily.  12/07/18   [provider]  lisinopril (ZESTRIL) 20 MG tablet Take 20 mg by mouth daily.  12/28/18   [provider]  methocarbamol (ROBAXIN) 750 MG tablet Take 1 tablet (750 mg total) by mouth 2 (two) times daily as needed for muscle spasms. 03/15/19   Leandrew Koyanagi, MD  oxyCODONE-acetaminophen (PERCOCET) 5-325 MG tablet Take 1-2 tablets by mouth every 8 (eight) hours as needed for severe pain. 03/15/19   Leandrew Koyanagi, MD  pravastatin (PRAVACHOL) 40 MG tablet Take 40 mg by mouth daily.  10/28/18   [provider]  senna-docusate ([place] S) 8.6-50  MG tablet Take 1-2 tablets by mouth at bedtime as needed. 03/15/19   Leandrew Koyanagi, MD  sodium chloride (OCEAN) 0.65 % SOLN nasal spray Place 1 spray into both nostrils as needed for congestion.    [provider]    Family History No family history on file.  Social History Social History   Tobacco Use  . Smoking status: Never Smoker  . Smokeless tobacco: Never Used  Substance Use Topics  . Alcohol use: Not Currently  . Drug use: Never     Allergies   Patient has no known allergies.   Review of Systems Review of Systems  Ten systems reviewed and are negative for acute change, except as noted in the HPI.   Physical Exam Updated Vital Signs BP 136/90 (BP Location: Right Arm)   Pulse 65   Temp  98.6 F (37 C) (Oral)   Resp 18   SpO2 100%   Physical Exam Vitals signs and nursing note reviewed.  Constitutional:      General: He is not in acute distress.    Appearance: He is well-developed. He is not diaphoretic.  HENT:     Head: Normocephalic and atraumatic.  Eyes:     General: No scleral icterus.    Conjunctiva/sclera: Conjunctivae normal.  Neck:     Musculoskeletal: Normal range of motion and neck supple.  Cardiovascular:     Rate and Rhythm: Normal rate and regular rhythm.     Heart sounds: Normal heart sounds.  Pulmonary:     Effort: Pulmonary effort is normal. No respiratory distress.     Breath sounds: Normal breath sounds.  Abdominal:     Palpations: Abdomen is soft.     Tenderness: There is no abdominal tenderness.  Musculoskeletal:     Comments: Picc line present in the RUE. Erythema and discharge noted  Skin:    General: Skin is warm and dry.  Neurological:     Mental Status: He is alert.  Psychiatric:        Behavior: Behavior normal.      ED Treatments / Results  Labs (all labs ordered are listed, but only abnormal results are displayed) Labs Reviewed - No data to display  EKG None  Radiology No results found.  Procedures Procedures (including critical care time)  Medications Ordered in ED Medications  lidocaine (XYLOCAINE) 1 % (with pres) injection (has no administration in time range)  heparin lock flush 100 UNIT/ML injection (has no administration in time range)  lidocaine (PF) (XYLOCAINE) 1 % injection (5 mLs Infiltration Given 03/31/19 1656)     Initial Impression / Assessment and Plan / ED Course  I have reviewed the triage vital signs and the nursing notes.  Pertinent labs & imaging results that were available during my care of the patient were reviewed by me and considered in my medical decision making (see chart for details).        Nursing PICC line team came here.  They were able to get the PICC line flushed but noted  the erythema discharge at the PICC line states that he needs to have it switched to the other extremity.  They state that they are unable to do that he should come back to the ER tomorrow.  I felt that this was poor patient care and consulted with Dr. Markus Daft of IR who is offered to put a PICC line in the left upper extremity.  Patient will go in about an hour.  Patient returned from IR.  He has a new left upper extremity PICC line in place.  Patient will be discharged to follow up with continued parenteral antibiotics.  He can consult with his orthopod as needed.  Patient appears appropriate for discharge at this time.  Final Clinical Impressions(s) / ED Diagnoses   Final diagnoses:  Complication of internal hip prosthetic joint [facility])    ED Discharge Orders    None       Margarita Mail, PA-C 03/31/19 2232    Little, Wenda Overland, MD 04/02/19 1721

## 2019-03-31 NOTE — Telephone Encounter (Signed)
Called and left a VM advising Helene Kelp of Dr. Phoebe Sharps message below and to call the office back with any questions.

## 2019-03-31 NOTE — Telephone Encounter (Signed)
Have them contact infectious disease.  They are managing the PICC line

## 2019-03-31 NOTE — Progress Notes (Signed)
   Patient Status: [facility] - ED  Assessment and Plan: Patient in need of venous access for home antibiotics.  Patient concern for infection at current PICC site.  R arm PICC to be removed.   Plan to place new PICC line.   ______________________________________________________________________   History of Present Illness: Dylan [name] is a 58 y.o. male with past medical history of prosthetic joint infection on IV antibiotics via R upper arm PICC line placed on unit prior to recent discharge.  Now with concerns for infection at line site.  IR consulted for replacement.  Allergies and medications reviewed.   Review of Systems: A 12 point ROS discussed and pertinent positives are indicated in the HPI above.  All other systems are negative.  Review of Systems  Constitutional: Negative for fatigue and fever.  Respiratory: Negative for cough and shortness of breath.   Cardiovascular: Negative for chest pain.  Musculoskeletal: Negative for back pain.  Psychiatric/Behavioral: Negative for behavioral problems and confusion.    Vital Signs: BP 136/90 (BP Location: Right Arm)   Pulse 65   Temp 98.6 F (37 C) (Oral)   Resp 18   SpO2 100%   Physical Exam Vitals signs and nursing note reviewed.  Constitutional:      Appearance: Normal appearance.  HENT:     Mouth/Throat:     Mouth: Mucous membranes are moist.     Pharynx: Oropharynx is clear.  Cardiovascular:     Rate and Rhythm: Normal rate.  Pulmonary:     Effort: Pulmonary effort is normal. No respiratory distress.  Skin:    Comments: R forearm PICC in place.  Recently redressed.  No obvious erythema.  Neurological:     General: No focal deficit present.     Mental Status: He is alert and oriented to person, place, and time. Mental status is at baseline.  Psychiatric:        Mood and Affect: Mood normal.        Behavior: Behavior normal.        Thought Content: Thought content normal.        Judgment: Judgment normal.      Imaging reviewed.   Labs:  COAGS: Recent Labs    01/14/19 1002  INR 1.0  APTT 35    BMP: Recent Labs    01/19/19 0433 03/11/19 1425 03/12/19 0320 03/13/19 0605  NA 134* 137 131* 135  K 5.3* 3.9 4.6 4.6  CL 103 106 103 106  CO2 23 22 21* 21*  GLUCOSE 156* 120* 257* 165*  BUN 24* 20 28* 34*  CALCIUM 8.4* 8.7* 7.8* 8.0*  CREATININE 1.53* 1.42* 2.39* 1.65*  GFRNONAA 49* 54* 29* 45*  GFRAA 57* >60 33* 52*       Electronically Signed: Docia Barrier, PA 03/31/2019, 4:28 PM   I spent a total of 15 minutes in face to face in clinical consultation, greater than 50% of which was counseling/coordinating care for venous access.

## 2019-04-07 ENCOUNTER — Telehealth: Payer: Self-pay

## 2019-04-07 ENCOUNTER — Telehealth: Payer: Self-pay | Admitting: Orthopaedic Surgery

## 2019-04-07 NOTE — Telephone Encounter (Signed)
Received call from [facility] nurse Rance Muir RN requesting new orders for dressing changes due to new blisters noted at surgical site.  Advised HH to call ortho for dressing changes orders.  Routing to provider as Juluis Rainier. Eugenia Mcalpine

## 2019-04-07 NOTE — Telephone Encounter (Signed)
I called and sw therapy they advised that pt is s/p a right total hip infection hardware removal and that the incision is draining and blistered. Offered appt with Dr. Erlinda Hong today and pt declined. He will come in tomorrow at 8:45

## 2019-04-07 NOTE — Telephone Encounter (Signed)
Needs a nurse or someone to call him right away. Message left on vm

## 2019-04-08 ENCOUNTER — Encounter: Payer: Self-pay | Admitting: Orthopaedic Surgery

## 2019-04-08 ENCOUNTER — Ambulatory Visit (INDEPENDENT_AMBULATORY_CARE_PROVIDER_SITE_OTHER): Payer: Medicare PPO

## 2019-04-08 ENCOUNTER — Ambulatory Visit (INDEPENDENT_AMBULATORY_CARE_PROVIDER_SITE_OTHER): Payer: Medicare PPO | Admitting: Orthopaedic Surgery

## 2019-04-08 VITALS — Ht 72.0 in | Wt 250.0 lb

## 2019-04-08 DIAGNOSIS — T8452XD Infection and inflammatory reaction due to internal left hip prosthesis, subsequent encounter: Secondary | ICD-10-CM

## 2019-04-08 MED ORDER — MUPIROCIN 2 % EX OINT: 1.0000 "application " | TOPICAL_OINTMENT | Freq: Two times a day (BID) | CUTANEOUS | 0 refills | Status: DC

## 2019-04-08 NOTE — Progress Notes (Signed)
   Post-Op Visit Note   Patient: Dylan [name]           Date of Birth: 22-Nov-1960           MRN: [id number] Visit Date: 04/08/2019 PCP: Frances Maywood, FNP   Assessment & Plan:  Chief Complaint:  Chief Complaint  Patient presents with  . Left Hip - Routine Post Op    Left hip I&D surgery; c/o drainage and blisters   Visit Diagnoses:  1. 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  He is still on IV antibiotics.  He denies any fevers or chills.  He is doing well overall except for some postsurgical pain in the left groin and hip region.  He comes in today for his first postoperative visit for suture removal. He has a couple of herniations through the surgical scar which does not appear to be infected and are filled with pus.  We remove the sutures today and applied Bactroban and Band-Aid over the herniations which should resolve.  X-rays demonstrate stable left total hip replacement without complication.  I like to recheck him in 6 weeks.  He is being followed by the ID clinic.  Follow-Up Instructions: Return in about 6 weeks (around 05/20/2019).   Orders:  Orders Placed This Encounter  Procedures  . XR Pelvis 1-2 Views   Meds ordered this encounter  Medications  . mupirocin ointment (BACTROBAN) 2 %    Sig: Apply 1 application topically 2 (two) times daily.    Dispense:  22 g    Refill:  0    Imaging: Xr Pelvis 1-2 Views  Result Date: 04/08/2019 Stable left total hip replacement without complication.   PMFS History: Patient Active Problem List   Diagnosis Date Noted  . Prosthetic joint infection of left hip ([place]) 03/11/2019  . Primary osteoarthritis of left hip 01/18/2019  . Status post total replacement of left hip 01/18/2019   Past Medical History:  Diagnosis Date  . Anxiety   . Arthritis   . Depression   . History of kidney  stones   . Hypertension   . Neck pain   . Sleep apnea    to start CPAP after surgery    No family history on file.  Past Surgical History:  Procedure Laterality Date  . ANTERIOR HIP REVISION Left 03/11/2019   Procedure: LEFT HIP IRRIGATION AND DEBRIDEMENT, HEAD AND LINER EXCHANGE;  Surgeon: Leandrew Koyanagi, MD;  Location: [facility];  Service: Orthopedics;  Laterality: Left;  Need RNFA  . HAND SURGERY Left    infected hand  . SHOULDER SURGERY Left    dislocated collor bone  . TOTAL HIP ARTHROPLASTY Left 01/18/2019  . TOTAL HIP ARTHROPLASTY Left 01/18/2019   Procedure: LEFT TOTAL HIP ARTHROPLASTY ANTERIOR APPROACH;  Surgeon: Leandrew Koyanagi, MD;  Location: [facility];  Service: Orthopedics;  Laterality: Left;   Social History   Occupational History  . Not on file  Tobacco Use  . Smoking status: Never Smoker  . Smokeless tobacco: Never Used  Substance and Sexual Activity  . Alcohol use: Not Currently  . Drug use: Never  . Sexual activity: Not on file

## 2019-04-14 ENCOUNTER — Ambulatory Visit: Payer: Medicare PPO | Admitting: Infectious Disease

## 2019-04-14 ENCOUNTER — Other Ambulatory Visit: Payer: Self-pay

## 2019-04-14 ENCOUNTER — Encounter: Payer: Self-pay | Admitting: Infectious Disease

## 2019-04-14 ENCOUNTER — Telehealth: Payer: Self-pay

## 2019-04-14 ENCOUNTER — Telehealth: Payer: Self-pay | Admitting: Orthopaedic Surgery

## 2019-04-14 VITALS — BP 136/89 | HR 89

## 2019-04-14 DIAGNOSIS — T8452XD Infection and inflammatory reaction due to internal left hip prosthesis, subsequent encounter: Secondary | ICD-10-CM | POA: Diagnosis not present

## 2019-04-14 DIAGNOSIS — I10 Essential (primary) hypertension: Secondary | ICD-10-CM | POA: Diagnosis not present

## 2019-04-14 DIAGNOSIS — A4901 Methicillin susceptible Staphylococcus aureus infection, unspecified site: Secondary | ICD-10-CM | POA: Diagnosis not present

## 2019-04-14 DIAGNOSIS — Z96642 Presence of left artificial hip joint: Secondary | ICD-10-CM

## 2019-04-14 HISTORY — DX: Essential (primary) hypertension: I10

## 2019-04-14 HISTORY — DX: Methicillin susceptible Staphylococcus aureus infection, unspecified site: A49.01

## 2019-04-14 MED ORDER — RIFAMPIN 300 MG PO CAPS: 300.0000 mg | ORAL_CAPSULE | Freq: Two times a day (BID) | ORAL | 6 refills | Status: DC

## 2019-04-14 MED ORDER — DOXYCYCLINE HYCLATE 100 MG PO TABS: 100.0000 mg | ORAL_TABLET | Freq: Two times a day (BID) | ORAL | 6 refills | Status: DC

## 2019-04-14 NOTE — Telephone Encounter (Signed)
Per Dr. Tommy Medal called patient's [facility] with verbal orders to pull picc after last dose of IV antibiotics. Spoke with Alla German, Rn who was able to take verbal order. Rn repeated and confirmed orders before ending call. [place]

## 2019-04-14 NOTE — Progress Notes (Signed)
Subjective:   Chief complaint purulent drainage from his surgical wound   Patient ID: Dylan [name], male    DOB: 10-May-1961, 58 y.o.   MRN: [id number]  HPI  Dylan [name] is a 58 y.o. male with prosthetic hip infection status post I&D, single staged exchange arthroplasty with methicillin sensitive Staphylococcus aureus growing on culture.  He is slotted to finish cefazolin 2 g every 8 hours on September 23.   He was also supposed to be on rifampin but states a prescription was never sent in for this.  Since he was in the hospital he was already having trouble with drainage of the operative site and has had problems with herniation and purulent material coming through the surgical site.  Is been followed closely by Dr. Erlinda Hong whom I believe he will see in the next 2 days.  I have some concern that this may be due to deeper infection of the knee may ultimately have to go back to the operating room.     Past Medical History:  Diagnosis Date  . Anxiety   . Arthritis   . Depression   . History of kidney stones   . Hypertension   . Neck pain   . Sleep apnea    to start CPAP after surgery    Past Surgical History:  Procedure Laterality Date  . ANTERIOR HIP REVISION Left 03/11/2019   Procedure: LEFT HIP IRRIGATION AND DEBRIDEMENT, HEAD AND LINER EXCHANGE;  Surgeon: Leandrew Koyanagi, MD;  Location: [facility];  Service: Orthopedics;  Laterality: Left;  Need RNFA  . HAND SURGERY Left    infected hand  . SHOULDER SURGERY Left    dislocated collor bone  . TOTAL HIP ARTHROPLASTY Left 01/18/2019  . TOTAL HIP ARTHROPLASTY Left 01/18/2019   Procedure: LEFT TOTAL HIP ARTHROPLASTY ANTERIOR APPROACH;  Surgeon: Leandrew Koyanagi, MD;  Location: [facility];  Service: Orthopedics;  Laterality: Left;    No family history on file.    Social History   Socioeconomic History  . Marital status: Married    Spouse name: Not on file  . Number of children: Not on file  . Years of education: Not on file  . Highest  education level: Not on file  Occupational History  . Not on file  Social Needs  . Financial resource strain: Not on file  . Food insecurity    Worry: Not on file    Inability: Not on file  . Transportation needs    Medical: Not on file    Non-medical: Not on file  Tobacco Use  . Smoking status: Never Smoker  . Smokeless tobacco: Never Used  Substance and Sexual Activity  . Alcohol use: Not Currently  . Drug use: Never  . Sexual activity: Not on file  Lifestyle  . Physical activity    Days per week: Not on file    Minutes per session: Not on file  . Stress: Not on file  Relationships  . Social Herbalist on phone: Not on file    Gets together: Not on file    Attends religious service: Not on file    Active member of club or organization: Not on file    Attends meetings of clubs or organizations: Not on file    Relationship status: Not on file  Other Topics Concern  . Not on file  Social History Narrative  . Not on file    No Known Allergies   Current Outpatient Medications:  .  amLODipine (NORVASC) 10 MG tablet, Take 10 mg by mouth every evening. , Disp: , Rfl:  .  aspirin EC 81 MG tablet, Take 1 tablet (81 mg total) by mouth 2 (two) times daily., Disp: 84 tablet, Rfl: 0 .  aspirin EC 81 MG tablet, Take 1 tablet (81 mg total) by mouth 2 (two) times daily., Disp: 84 tablet, Rfl: 0 .  ceFAZolin (ANCEF) IVPB, Inject 2 g into the vein every 8 (eight) hours. Indication:  Prosthetic Joint Infection Last Day of Therapy:  04/21/2019 Labs - Once weekly:  CBC/D and BMP, Labs - Every other week:  ESR and CRP, Disp: 117 Units, Rfl: 0 .  DULoxetine (CYMBALTA) 30 MG capsule, Take 30 mg by mouth every evening. , Disp: , Rfl:  .  gabapentin (NEURONTIN) 800 MG tablet, Take 800 mg by mouth 4 (four) times daily. , Disp: , Rfl:  .  lisinopril (ZESTRIL) 20 MG tablet, Take 20 mg by mouth daily. , Disp: , Rfl:  .  methocarbamol (ROBAXIN) 750 MG tablet, Take 1 tablet (750 mg total)  by mouth 2 (two) times daily as needed for muscle spasms., Disp: 60 tablet, Rfl: 0 .  mupirocin ointment (BACTROBAN) 2 %, Apply 1 application topically 2 (two) times daily., Disp: 22 g, Rfl: 0 .  oxyCODONE-acetaminophen (PERCOCET) 5-325 MG tablet, Take 1-2 tablets by mouth every 8 (eight) hours as needed for severe pain., Disp: 30 tablet, Rfl: 0 .  pravastatin (PRAVACHOL) 40 MG tablet, Take 40 mg by mouth daily. , Disp: , Rfl:  .  senna-docusate (SENOKOT S) 8.6-50 MG tablet, Take 1-2 tablets by mouth at bedtime as needed., Disp: 30 tablet, Rfl: 1 .  sodium chloride (OCEAN) 0.65 % SOLN nasal [name], Place 1 [name] into both nostrils as needed for congestion., Disp: , Rfl:     Review of Systems  Constitutional: Negative for activity change, appetite change, chills, diaphoresis, fatigue, fever and unexpected weight change.  HENT: Negative for congestion, rhinorrhea, sinus pressure, sneezing, sore throat and trouble swallowing.   Eyes: Negative for photophobia and visual disturbance.  Respiratory: Negative for cough, chest tightness, shortness of breath, wheezing and stridor.   Cardiovascular: Negative for chest pain, palpitations and leg swelling.  Gastrointestinal: Negative for abdominal distention, abdominal pain, anal bleeding, blood in stool, constipation, diarrhea, nausea and vomiting.  Genitourinary: Negative for difficulty urinating, dysuria, flank pain and hematuria.  Musculoskeletal: Positive for myalgias. Negative for arthralgias, back pain, gait problem and joint swelling.  Skin: Positive for wound. Negative for color change, pallor and rash.  Neurological: Negative for dizziness, tremors, weakness and light-headedness.  Hematological: Negative for adenopathy. Does not bruise/bleed easily.  Psychiatric/Behavioral: Negative for agitation, behavioral problems, confusion, decreased concentration, dysphoric mood and sleep disturbance.       Objective:   Physical Exam Constitutional:       Appearance: He is well-developed.  HENT:     Head: Normocephalic and atraumatic.  Eyes:     Conjunctiva/sclera: Conjunctivae normal.  Neck:     Musculoskeletal: Normal range of motion and neck supple.  Cardiovascular:     Rate and Rhythm: Normal rate and regular rhythm.  Pulmonary:     Effort: Pulmonary effort is normal. No respiratory distress.     Breath sounds: No wheezing.  Abdominal:     General: There is no distension.     Palpations: Abdomen is soft.  Musculoskeletal: Normal range of motion.        General: No tenderness.  Skin:  General: Skin is warm and dry.     Coloration: Skin is not pale.     Findings: No erythema or rash.  Neurological:     Mental Status: He is alert and oriented to person, place, and time.  Psychiatric:        Mood and Affect: Mood normal.        Behavior: Behavior normal.        Thought Content: Thought content normal.        Judgment: Judgment normal.     PICC line is clean dry and intact 04/14/2019:      Left surgical wound with some purulent areas pictured below April 14, 2019:          Assessment & Plan:   Prosthetic hip infection status post I&D with single staged exchange arthroplasty:  I am concerned that he is showing evidence of failure of this approach and will ultimately need a two-stage procedure to fix this problem.  In the interim I will have him complete his cefazolin IV and I am adding rifampin to this.  If he is to avoid surgery then I would change him over to oral antibiotics in the form of doxycycline twice daily along with rifampin for period of minimum of 6 months.  Again I have a high amount of anxiety that he will require further surgical to remove his prosthesis and undergo a two-stage reimplantation with a 6-week course of IV antibiotics in between and some careful period of observation prior to reimplantation  I spent greater than 25 minutes with the patient including greater than 50% of time in  face to face counsel of the patient the nature of prosthetic joint infections and how they are treated with both surgical and medical management and in coordination of his care.

## 2019-04-14 NOTE — Patient Instructions (Signed)
Start RIFAMPIN 300mg  every 12 hours today  Start Doxycycline 100mg  every 12 hours on the 23rd when you finish your intravenous antibiotics

## 2019-04-14 NOTE — Telephone Encounter (Signed)
Patient called stating that he was just seen last week and has an appointment on 04/16/19 and wanted to know if he need to keep that one or not.  He has a f/u on October 22nd with Mendel Ryder.  CB#[phone].  Thank you.

## 2019-04-15 ENCOUNTER — Telehealth: Payer: Self-pay | Admitting: Orthopaedic Surgery

## 2019-04-15 NOTE — Telephone Encounter (Signed)
Per last office note, patient did not need follow up for six weeks. Could you please call patient and tell him ok to cancel 04/16/2019 appt unless he feels he needs to see them before?

## 2019-04-15 NOTE — Telephone Encounter (Signed)
Called patient left message on voicemail that the 04/16/2019 appointment was canceled with Dr Erlinda Hong and he is to keep the 05/20/2019 appointment. Asked if patient can call back to confirm he received message

## 2019-04-16 ENCOUNTER — Ambulatory Visit: Payer: Medicare PPO | Admitting: Orthopaedic Surgery

## 2019-04-20 ENCOUNTER — Telehealth: Payer: Self-pay

## 2019-04-20 NOTE — Telephone Encounter (Signed)
Received call from [place] to confirm pull picc orders on 04/21/19. Orders confirmed via last office note with Dr. Tommy Medal. Eugenia Mcalpine

## 2019-05-20 ENCOUNTER — Ambulatory Visit (INDEPENDENT_AMBULATORY_CARE_PROVIDER_SITE_OTHER): Payer: Medicare PPO | Admitting: Physician Assistant

## 2019-05-20 ENCOUNTER — Other Ambulatory Visit: Payer: Self-pay

## 2019-05-20 ENCOUNTER — Encounter: Payer: Self-pay | Admitting: Physician Assistant

## 2019-05-20 DIAGNOSIS — T8452XD Infection and inflammatory reaction due to internal left hip prosthesis, subsequent encounter: Secondary | ICD-10-CM

## 2019-05-20 NOTE — Progress Notes (Signed)
   Post-Op Visit Note   Patient: Dylan [name]           Date of Birth: 01-Aug-1960           MRN: [id number] Visit Date: 05/20/2019 PCP: Frances Maywood, FNP   Assessment & Plan:  Chief Complaint:  Chief Complaint  Patient presents with  . Left Hip - Pain   Visit Diagnoses:  1. 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  He has been doing well.  He has been following up with Dr. Drucilla Schmidt where his PICC line has been removed.  He is currently on doxycycline and rifampin.  No fevers or chills.  No pain.  No drainage.  Scanned labs in epic show an overall decrease in CRP and fluctuations in ESR.  Examination of his left hip reveals a fully healed surgical scar without evidence of cellulitis or infection.  There are no blisters and no herniations.  No swelling.  Calf is soft and nontender.  He is neurovascularly intact distally.  At this point, he will finish out his antibiotics according to Dr. Drucilla Schmidt.  He will follow-up with [place] in 2 months time when he 6 months out from surgery for repeat evaluation and AP pelvis lateral left hip x-rays.  He will call with any concerns or questions in the meantime.  Follow-Up Instructions: Return in about 2 months (around 07/20/2019).   Orders:  No orders of the defined types were placed in this encounter.  No orders of the defined types were placed in this encounter.   Imaging: No new imaging  PMFS History: Patient Active Problem List   Diagnosis Date Noted  . MSSA (methicillin susceptible Staphylococcus aureus) infection 04/14/2019  . HTN (hypertension) 04/14/2019  . Prosthetic joint infection of left hip ([place]) 03/11/2019  . Primary osteoarthritis of left hip 01/18/2019  . Status post total  replacement of left hip 01/18/2019   Past Medical History:  Diagnosis Date  . Anxiety   . Arthritis   . Depression   . History of kidney stones   . HTN (hypertension) 04/14/2019  . Hypertension   . MSSA (methicillin susceptible Staphylococcus aureus) infection 04/14/2019  . Neck pain   . Sleep apnea    to start CPAP after surgery    History reviewed. No pertinent family history.  Past Surgical History:  Procedure Laterality Date  . ANTERIOR HIP REVISION Left 03/11/2019   Procedure: LEFT HIP IRRIGATION AND DEBRIDEMENT, HEAD AND LINER EXCHANGE;  Surgeon: Leandrew Koyanagi, MD;  Location: [facility];  Service: Orthopedics;  Laterality: Left;  Need RNFA  . HAND SURGERY Left    infected hand  . SHOULDER SURGERY Left    dislocated collor bone  . TOTAL HIP ARTHROPLASTY Left 01/18/2019  . TOTAL HIP ARTHROPLASTY Left 01/18/2019   Procedure: LEFT TOTAL HIP ARTHROPLASTY ANTERIOR APPROACH;  Surgeon: Leandrew Koyanagi, MD;  Location: [facility];  Service: Orthopedics;  Laterality: Left;   Social History   Occupational History  . Not on file  Tobacco Use  . Smoking status: Never Smoker  . Smokeless tobacco: Never Used  Substance and Sexual Activity  . Alcohol use: Not Currently  . Drug use: Never  . Sexual activity: Not on file

## 2019-05-25 ENCOUNTER — Other Ambulatory Visit: Payer: Self-pay

## 2019-05-26 ENCOUNTER — Ambulatory Visit: Payer: Medicare PPO | Admitting: Infectious Disease

## 2019-06-07 ENCOUNTER — Ambulatory Visit (INDEPENDENT_AMBULATORY_CARE_PROVIDER_SITE_OTHER): Payer: Medicare PPO | Admitting: Infectious Disease

## 2019-06-07 ENCOUNTER — Other Ambulatory Visit: Payer: Self-pay

## 2019-06-07 ENCOUNTER — Encounter: Payer: Self-pay | Admitting: Infectious Disease

## 2019-06-07 VITALS — Wt 264.0 lb

## 2019-06-07 DIAGNOSIS — T8452XD Infection and inflammatory reaction due to internal left hip prosthesis, subsequent encounter: Secondary | ICD-10-CM

## 2019-06-07 DIAGNOSIS — Z20822 Contact with and (suspected) exposure to covid-19: Secondary | ICD-10-CM

## 2019-06-07 DIAGNOSIS — I10 Essential (primary) hypertension: Secondary | ICD-10-CM | POA: Diagnosis not present

## 2019-06-07 DIAGNOSIS — A4901 Methicillin susceptible Staphylococcus aureus infection, unspecified site: Secondary | ICD-10-CM

## 2019-06-07 DIAGNOSIS — Z20828 Contact with and (suspected) exposure to other viral communicable diseases: Secondary | ICD-10-CM

## 2019-06-07 HISTORY — DX: Contact with and (suspected) exposure to covid-19: Z20.822

## 2019-06-07 NOTE — Progress Notes (Signed)
Subjective:   Chief complaintt: fever chills, largely dry cough for at least a week.   Patient ID: Dylan [name], male    DOB: 1961-06-02, 58 y.o.   MRN: [id number]  HPI  [name] is a 58 yo. male with prosthetic hip infection status post I&D, single staged exchange arthroplasty with methicillin sensitive Staphylococcus aureus growing on culture.  He has finished cefazolin 2 g every 8 hours on September 23.   He was also supposed to be on rifampin but states a prescription was never sent in for this.  At lab his last visit he had drainage from wound I was concerned about need for further surgery.  Since then he has been on doxycycline with rifampin and tolerating this well.  The wound is healing up well and his orthopedic surgeon is happy with his progress.  Pain of occasional pain at the left hip site.  On review of systems he states that he had has had fevers subjectively along with chills and a cough for the last week which caused him to cancel his appointment.  I am not understanding how he possibly passed our screening algorithm and ended up in the clinic  When I went to examine him I was wearing  Surgical mask and face shield and patient was wearing a mask and coughing. I was not wearing N95 of gown.      Past Medical History:  Diagnosis Date  . Anxiety   . Arthritis   . Depression   . History of kidney stones   . HTN (hypertension) 04/14/2019  . Hypertension   . MSSA (methicillin susceptible Staphylococcus aureus) infection 04/14/2019  . Neck pain   . Sleep apnea    to start CPAP after surgery    Past Surgical History:  Procedure Laterality Date  . ANTERIOR HIP REVISION Left 03/11/2019   Procedure: LEFT HIP IRRIGATION AND DEBRIDEMENT, HEAD AND LINER EXCHANGE;  Surgeon: Leandrew Koyanagi, MD;  Location: [facility];  Service: Orthopedics;  Laterality: Left;  Need RNFA  . HAND SURGERY Left    infected hand  . SHOULDER SURGERY Left    dislocated collor bone  . TOTAL HIP  ARTHROPLASTY Left 01/18/2019  . TOTAL HIP ARTHROPLASTY Left 01/18/2019   Procedure: LEFT TOTAL HIP ARTHROPLASTY ANTERIOR APPROACH;  Surgeon: Leandrew Koyanagi, MD;  Location: [facility];  Service: Orthopedics;  Laterality: Left;    No family history on file.    Social History   Socioeconomic History  . Marital status: Married    Spouse name: Not on file  . Number of children: Not on file  . Years of education: Not on file  . Highest education level: Not on file  Occupational History  . Not on file  Social Needs  . Financial resource strain: Not on file  . Food insecurity    Worry: Not on file    Inability: Not on file  . Transportation needs    Medical: Not on file    Non-medical: Not on file  Tobacco Use  . Smoking status: Never Smoker  . Smokeless tobacco: Never Used  Substance and Sexual Activity  . Alcohol use: Not Currently  . Drug use: Never  . Sexual activity: Not on file  Lifestyle  . Physical activity    Days per week: Not on file    Minutes per session: Not on file  . Stress: Not on file  Relationships  . Social connections    Talks on phone: Not on  file    Gets together: Not on file    Attends religious service: Not on file    Active member of club or organization: Not on file    Attends meetings of clubs or organizations: Not on file    Relationship status: Not on file  Other Topics Concern  . Not on file  Social History Narrative  . Not on file    No Known Allergies   Current Outpatient Medications:  .  amLODipine (NORVASC) 10 MG tablet, Take 10 mg by mouth every evening. , Disp: , Rfl:  .  aspirin EC 81 MG tablet, Take 1 tablet (81 mg total) by mouth 2 (two) times daily., Disp: 84 tablet, Rfl: 0 .  aspirin EC 81 MG tablet, Take 1 tablet (81 mg total) by mouth 2 (two) times daily., Disp: 84 tablet, Rfl: 0 .  ceFAZolin (ANCEF) 10 g injection, , Disp: , Rfl:  .  doxycycline (VIBRA-TABS) 100 MG tablet, Take 1 tablet (100 mg total) by mouth 2 (two) times  daily., Disp: 60 tablet, Rfl: 6 .  DULoxetine (CYMBALTA) 30 MG capsule, Take 30 mg by mouth every evening. , Disp: , Rfl:  .  gabapentin (NEURONTIN) 800 MG tablet, Take 800 mg by mouth 4 (four) times daily. , Disp: , Rfl:  .  lisinopril (ZESTRIL) 20 MG tablet, Take 20 mg by mouth daily. , Disp: , Rfl:  .  methocarbamol (ROBAXIN) 750 MG tablet, Take 1 tablet (750 mg total) by mouth 2 (two) times daily as needed for muscle spasms., Disp: 60 tablet, Rfl: 0 .  mupirocin ointment (BACTROBAN) 2 %, Apply 1 application topically 2 (two) times daily., Disp: 22 g, Rfl: 0 .  oxyCODONE-acetaminophen (PERCOCET) 5-325 MG tablet, Take 1-2 tablets by mouth every 8 (eight) hours as needed for severe pain., Disp: 30 tablet, Rfl: 0 .  pravastatin (PRAVACHOL) 40 MG tablet, Take 40 mg by mouth daily. , Disp: , Rfl:  .  predniSONE (DELTASONE) 50 MG tablet, , Disp: , Rfl:  .  rifampin (RIFADIN) 300 MG capsule, Take 1 capsule (300 mg total) by mouth 2 (two) times daily., Disp: 60 capsule, Rfl: 6 .  senna-docusate (SENOKOT S) 8.6-50 MG tablet, Take 1-2 tablets by mouth at bedtime as needed., Disp: 30 tablet, Rfl: 1 .  sodium chloride (OCEAN) 0.65 % SOLN nasal spray, Place 1 spray into both nostrils as needed for congestion., Disp: , Rfl:  .  triamcinolone cream (KENALOG) 0.1 %, , Disp: , Rfl:     Review of Systems  Constitutional: Positive for fever. Negative for activity change, appetite change, chills, diaphoresis, fatigue and unexpected weight change.  HENT: Negative for congestion, rhinorrhea, sinus pressure, sneezing, sore throat and trouble swallowing.   Eyes: Negative for photophobia and visual disturbance.  Respiratory: Positive for cough. Negative for chest tightness, shortness of breath, wheezing and stridor.   Cardiovascular: Negative for chest pain, palpitations and leg swelling.  Gastrointestinal: Negative for abdominal distention, abdominal pain, anal bleeding, blood in stool, constipation, diarrhea,  nausea and vomiting.  Genitourinary: Negative for difficulty urinating, dysuria, flank pain and hematuria.  Musculoskeletal: Positive for myalgias. Negative for arthralgias, back pain, gait problem and joint swelling.  Skin: Positive for wound. Negative for color change, pallor and rash.  Neurological: Negative for dizziness, tremors, weakness and light-headedness.  Hematological: Negative for adenopathy. Does not bruise/bleed easily.  Psychiatric/Behavioral: Negative for agitation, behavioral problems, confusion, decreased concentration, dysphoric mood and sleep disturbance.       Objective:  Physical Exam Constitutional:      Appearance: He is well-developed.  HENT:     Head: Normocephalic and atraumatic.  Eyes:     Conjunctiva/sclera: Conjunctivae normal.  Neck:     Musculoskeletal: Normal range of motion and neck supple.  Cardiovascular:     Rate and Rhythm: Normal rate and regular rhythm.  Pulmonary:     Effort: Pulmonary effort is normal. No respiratory distress.     Breath sounds: No wheezing.  Abdominal:     General: There is no distension.     Palpations: Abdomen is soft.  Musculoskeletal: Normal range of motion.        General: No tenderness.  Skin:    General: Skin is warm and dry.     Coloration: Skin is not pale.     Findings: No erythema or rash.  Neurological:     Mental Status: He is alert and oriented to person, place, and time.  Psychiatric:        Mood and Affect: Mood normal.        Behavior: Behavior normal.        Thought Content: Thought content normal.        Judgment: Judgment normal.      Left surgical wound with some purulent areas pictured below April 14, 2019:      Wound today 06/07/2019:        Assessment & Plan:    Suspected novel coronavirus 2019:  We will send nasopharyngeal swab for PCR testing.  He is to quarantine in the interim per CDC guidelines   Prosthetic hip infection status post I&D with single staged  exchange arthroplasty: Continue doxycycline rifampin reschedule clinic visit for 1 month's time.

## 2019-06-07 NOTE — Patient Instructions (Signed)
   If you are sick with COVID-19 or think you might have COVID-19, follow the steps below to care for yourself and to help protect other people in your home and community.   Stay home except to get medical care Stay home. Most people with COVID-19 have mild illness and can recover at home without medical care. Do not leave your home, except to get medical care. Do not visit public areas . Take care of yourself. Get rest and stay hydrated. Take over-the-counter medicines, such as acetaminophen, to help you feel better.  Stay in touch with your doctor. Call before you get medical care. Be sure to get care if you have trouble breathing, or have any other emergency warning signs, or if you think it is an emergency.  Avoid public transportation, ride-sharing, or taxis.   Separate yourself from other people As much as possible, stay in a specific room and away from other people and pets in your home. If possible, you should use a separate bathroom. If you need to be around other people or animals in or outside of the home, wear a mask.  Tell your close contacts that they may have been exposed to COVID-19. An infected person can spread COVID-19 starting 48 hours (or 2 days) before the person has any symptoms or tests positive. By letting your close contacts know they may have been exposed to COVID-19, you are helping to protect everyone.  Additional guidance is available for those living in close quarters and shared housing.  Monitor your symptoms  Symptoms of COVID-19 include fever, cough, or other symptoms.  Follow care instructions from your healthcare provider and local health department. Your local health authorities may give instructions on checking your symptoms and reporting information.  When to seek emergency medical attention  Look for emergency warning signs* for COVID-19. If someone is showing any of these signs, seek emergency medical care immediately:  Trouble  breathing Persistent pain or pressure in the chest New confusion Inability to wake or stay awake Bluish lips or face *This list is not all possible symptoms. Please call your medical provider for any other symptoms that are severe or concerning to you.  Call 911 or call ahead to your local emergency facility: Notify the operator that you are seeking care for someone who has or may have COVID-19.  mobile light icon Call ahead before visiting your doctor Call ahead. Many medical visits for routine care are being postponed or done by phone or telemedicine. If you have a medical appointment that cannot be postponed, call your doctor's office, and tell them you have or may have COVID-19. This will help

## 2019-06-09 LAB — SARS-COV-2 RNA,(COVID-19) QUALITATIVE NAAT: SARS CoV2 RNA: NOT DETECTED

## 2019-07-20 ENCOUNTER — Ambulatory Visit: Payer: Medicare PPO | Admitting: Orthopaedic Surgery

## 2019-08-10 ENCOUNTER — Ambulatory Visit: Payer: Medicare PPO | Admitting: Orthopaedic Surgery

## 2020-01-17 ENCOUNTER — Telehealth: Payer: Self-pay | Admitting: Orthopaedic Surgery

## 2020-01-17 NOTE — Telephone Encounter (Signed)
Patient called,needing to get copy of records. Wants to be able to sign release and get records at same time since he coming from [place], [place]. I advised him that would be fine. Records will be ready and release attached to complete and sign.

## 2020-06-02 ENCOUNTER — Encounter: Payer: Self-pay | Admitting: Orthopaedic Surgery

## 2020-06-02 ENCOUNTER — Ambulatory Visit: Payer: Self-pay

## 2020-06-02 ENCOUNTER — Ambulatory Visit: Payer: Medicare PPO | Admitting: Orthopaedic Surgery

## 2020-06-02 ENCOUNTER — Ambulatory Visit (INDEPENDENT_AMBULATORY_CARE_PROVIDER_SITE_OTHER): Payer: Medicare PPO

## 2020-06-02 DIAGNOSIS — M25552 Pain in left hip: Secondary | ICD-10-CM

## 2020-06-02 DIAGNOSIS — Z96642 Presence of left artificial hip joint: Secondary | ICD-10-CM

## 2020-06-02 DIAGNOSIS — M25551 Pain in right hip: Secondary | ICD-10-CM | POA: Diagnosis not present

## 2020-06-02 DIAGNOSIS — M1611 Unilateral primary osteoarthritis, right hip: Secondary | ICD-10-CM

## 2020-06-02 NOTE — Progress Notes (Signed)
Office Visit Note   Patient: Dylan [name]           Date of Birth: 1960-11-08           MRN: [id number] Visit Date: 06/02/2020              Requested by: Vanessa Homeland, FNP [address] [place],  [place] [address] PCP: Vanessa Liberal, FNP   Assessment & Plan: Visit Diagnoses:  1. Primary osteoarthritis of right hip   2. Status post total hip replacement, left     Plan: Impression is a year and a half status post left total hip replacement and mild to moderate right hip OA.  Overall the symptoms are well-tolerated.  He would like to have a cortisone injection in the right hip joint today.  I have also given him a prescription for outpatient PT to work on IT band stretching for his hips.  He will follow up with [place] as needed.  Follow-Up Instructions: No follow-ups on file.   Orders:  Orders Placed This Encounter  Procedures  . XR HIP UNILAT W OR W/O PELVIS 2-3 VIEWS LEFT  . XR HIP UNILAT W OR W/O PELVIS 2-3 VIEWS RIGHT  . US Guided Needle Placement - No Linked Charges   No orders of the defined types were placed in this encounter.     Procedures: No procedures performed   Clinical Data: No additional findings.   Subjective: Chief Complaint  Patient presents with  . Left Hip - Pain    [name] is a 59 year old gentleman who comes in for follow-up of his left total hip replacement in June 2020 which was subsequently washed out for acute infection about 6 weeks later.  He states overall the left hip is doing well with some soreness and discomfort at times with activity especially.  His right hip has started to bother him more.  He has right hip and groin pain as well as lateral hip pain.  He endorses occasional tingling at times.  The right hip has been bothering him for about 3 months.  He has been taking ibuprofen and Tylenol.  Denies any constitutional symptoms or any evidence of infection.   Review of Systems  Constitutional: Negative.   All other systems  reviewed and are negative.    Objective: Vital Signs: There were no vitals taken for this visit.  Physical Exam Vitals and nursing note reviewed.  Constitutional:      Appearance: He is well-developed.  Pulmonary:     Effort: Pulmonary effort is normal.  Abdominal:     Palpations: Abdomen is soft.  Skin:    General: Skin is warm.  Neurological:     Mental Status: He is alert and oriented to person, place, and time.  Psychiatric:        Behavior: Behavior normal.        Thought Content: Thought content normal.        Judgment: Judgment normal.     Ortho Exam Left hip shows a fully healed surgical scar.  No swelling or evidence of infection.  He has great range of motion without pain.  Slight tenderness laterally over the greater trochanter.  Right hip shows well-preserved range of motion with mild to moderate discomfort.  Slight tenderness to palpation laterally.  No sciatic tension signs. Specialty Comments:  No specialty comments available.  Imaging: US Guided Needle Placement - No Linked Charges  Result Date: 06/02/2020 Ultrasound guided injection is preferred based studies  that show increased duration, increased effect, greater accuracy, decreased procedural pain, increased response rate, and decreased cost with ultrasound guided versus blind injection.   Verbal informed consent obtained.  Time-out conducted.  Noted no overlying erythema, induration, or other signs of local infection. Ultrasound-guided right hip injection: After sterile prep with Betadine, injected 8 cc 1% lidocaine without epinephrine and 6 mg betamethasone using a 22-gauge spinal needle, passing the needle through the iliofemoral ligament into the femoral head/neck junction.  Injectate seen filling the joint capsule.  Good immediate relief.  XR HIP UNILAT W OR W/O PELVIS 2-3 VIEWS LEFT  Result Date: 06/02/2020 Stable total hip replacement without complication  XR HIP UNILAT W OR W/O PELVIS 2-3 VIEWS  RIGHT  Result Date: 06/02/2020 Advanced right hip degenerative joint disease.  Minimal joint space remaining.    PMFS History: Patient Active Problem List   Diagnosis Date Noted  . Primary osteoarthritis of right hip 06/02/2020  . Status post total hip replacement, left 06/02/2020  . Suspected COVID-19 virus infection 06/07/2019  . MSSA (methicillin susceptible Staphylococcus aureus) infection 04/14/2019  . HTN (hypertension) 04/14/2019  . 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 Primary osteoarthritis of left hip 01/18/2019  . Status post total replacement of left hip 01/18/2019   Past Medical History:  Diagnosis Date  . Anxiety   . Arthritis   . Depression   . History of kidney stones   . HTN (hypertension) 04/14/2019  . Hypertension   . MSSA (methicillin susceptible Staphylococcus aureus) infection 04/14/2019  . Neck pain   . Sleep apnea    to start CPAP after surgery  . Suspected COVID-19 virus infection 06/07/2019    History reviewed. No pertinent family history.  Past Surgical History:  Procedure Laterality Date  . ANTERIOR HIP REVISION Left 03/11/2019   Procedure: LEFT HIP IRRIGATION AND DEBRIDEMENT, HEAD AND LINER EXCHANGE;  Surgeon: Tarry Kos, MD;  Location: [facility] OR;  Service: Orthopedics;  Laterality: Left;  Need RNFA  . HAND SURGERY Left    infected hand  . SHOULDER SURGERY Left    dislocated collor bone  . TOTAL HIP ARTHROPLASTY Left 01/18/2019  . TOTAL HIP ARTHROPLASTY Left 01/18/2019   Procedure: LEFT TOTAL HIP ARTHROPLASTY ANTERIOR APPROACH;  Surgeon: Tarry Kos, MD;  Location: [facility] OR;  Service: Orthopedics;  Laterality: Left;   Social History   Occupational History  . Not on file  Tobacco Use  . Smoking status: Never Smoker  . Smokeless tobacco: Never Used  Vaping Use  . Vaping Use: Never used  Substance and Sexual Activity  . Alcohol use: Not Currently  . Drug use: Never  . Sexual activity: Not on file

## 2020-06-02 NOTE — Progress Notes (Signed)
Subjective: Patient is here for ultrasound-guided intra-articular right hip injection.   Chronic pain on the anterior and lateral aspect.  Objective: Still has pretty good range of motion but he does have pain with passive flexion and internal rotation.  Procedure: Ultrasound guided injection is preferred based studies that show increased duration, increased effect, greater accuracy, decreased procedural pain, increased response rate, and decreased cost with ultrasound guided versus blind injection.   Verbal informed consent obtained.  Time-out conducted.  Noted no overlying erythema, induration, or other signs of local infection. Ultrasound-guided right hip injection: After sterile prep with Betadine, injected 8 cc 1% lidocaine without epinephrine and 6 mg betamethasone using a 22-gauge spinal needle, passing the needle through the iliofemoral ligament into the femoral head/neck junction.  Injectate seen filling the joint capsule.  Good immediate relief.

## 2021-02-13 ENCOUNTER — Ambulatory Visit (INDEPENDENT_AMBULATORY_CARE_PROVIDER_SITE_OTHER): Payer: Medicare PPO

## 2021-02-13 ENCOUNTER — Ambulatory Visit: Payer: Medicare PPO | Admitting: Orthopaedic Surgery

## 2021-02-13 ENCOUNTER — Ambulatory Visit: Payer: Self-pay

## 2021-02-13 ENCOUNTER — Encounter: Payer: Self-pay | Admitting: Orthopaedic Surgery

## 2021-02-13 DIAGNOSIS — M1611 Unilateral primary osteoarthritis, right hip: Secondary | ICD-10-CM | POA: Diagnosis not present

## 2021-02-13 DIAGNOSIS — M1712 Unilateral primary osteoarthritis, left knee: Secondary | ICD-10-CM | POA: Diagnosis not present

## 2021-02-13 MED ORDER — LIDOCAINE HCL 1 % IJ SOLN
2.0000 mL | INTRAMUSCULAR | Status: AC | PRN
  Administered 2021-02-13: 2 mL

## 2021-02-13 MED ORDER — BUPIVACAINE HCL 0.25 % IJ SOLN
2.0000 mL | INTRAMUSCULAR | Status: AC | PRN
  Administered 2021-02-13: 2 mL via INTRA_ARTICULAR

## 2021-02-13 MED ORDER — METHYLPREDNISOLONE ACETATE 40 MG/ML IJ SUSP
40.0000 mg | INTRAMUSCULAR | Status: AC | PRN
  Administered 2021-02-13: 40 mg via INTRA_ARTICULAR

## 2021-02-13 NOTE — Progress Notes (Signed)
Office Visit Note   Patient: Dylan [name]           Date of Birth: [birth year]           MRN: [id number] Visit Date: 02/13/2021              Requested by: Vanessa [place], FNP [address] [place],  [place] [address] PCP: Vanessa Mantador, FNP   Assessment & Plan: Visit Diagnoses:  1. Primary osteoarthritis of right hip   2. Primary osteoarthritis of left knee     Plan: Impression is right hip degenerative joint disease and chronic left hip and knee pain.  In regards to the right hip, we have discussed various treatment options to include repeat cortisone injection versus surgical intervention to include total hip replacement.  He would like to go ahead and proceed with total hip replacement.  He currently has a BMI of 39.9 so he has been instructed to try and lose weight prior to surgical intervention.  He will follow-up with Dr Roda Shutters in 2 weeks time for repeat evaluation Of the left hip pain and further determination of proceeding with right total hip replacement based on his response to left knee injection.  In regards to the left hip and knee, he does have advanced degenerative changes to the left knee which I feel as though could be causing his knee and possibly thigh pain.  Today, we proceeded with a diagnostic and hopefully therapeutic intra-articular cortisone injection to the left knee.  We will see how he is in 2 weeks.  Call with concerns or questions.  Follow-Up Instructions: No follow-ups on file.   Orders:  Orders Placed This Encounter  Procedures   Large Joint Inj   XR Pelvis 1-2 Views   XR Knee Complete 4 Views Left   US Guided Needle Placement - No Linked Charges   No orders of the defined types were placed in this encounter.     Procedures: Large Joint Inj: L knee on 02/13/2021 11:09 AM Indications: pain Details: 22 G needle, anterolateral approach Medications: 2 mL lidocaine 1 %; 2 mL bupivacaine 0.25 %; 40 mg methylPREDNISolone acetate 40  MG/ML     Clinical Data: No additional findings.   Subjective: Chief Complaint  Patient presents with   Right Hip - Pain    HPI patient is a pleasant 60 year old gentleman who comes in today with bilateral hip pain.  He is status post left total hip replacement in June 2020 with subsequent infection for which he underwent I&D approximately 6 weeks later.  He was doing well until about a year ago.  About that time, he started noticing pain to both hips and was favoring the left side.  He began having mild discomfort to the groin and anterior thigh, but mostly pain to the knee.  Pain is worse with walking as well as going up and down hills.  He has taken over-the-counter pain medication without significant relief.  In regards to the right hip, he was seen in our office back in November 2021 where he was referred to Dr. Prince Rome for cortisone injection.  He notes great relief in symptoms which only lasted for a few months.  He denies any paresthesias to either lower extremity.  He is requesting repeat cortisone injection to the right hip today. Review of Systems as detailed in HPI.  All others reviewed and are negative.   Objective: Vital Signs: There were no vitals taken for this visit.  Physical  Exam well-developed and well-nourished gentleman in no acute distress.  Alert and oriented x3.  Ortho Exam left hip exam shows mild groin pain with logroll.  Negative straight leg raise.  Knee exam is unremarkable.  Right hip exam shows a positive logroll.  Positive FADIR.  He is neurovascular intact distally.  Specialty Comments:  No specialty comments available.  Imaging: No results found.   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 No pertinent family history.  Past Surgical History:  Procedure Laterality Date   ANTERIOR HIP REVISION Left 03/11/2019   Procedure: LEFT HIP IRRIGATION AND DEBRIDEMENT, HEAD AND LINER EXCHANGE;  Surgeon: Tarry Kos, MD;  Location: [facility] OR;  Service: Orthopedics;  Laterality: Left;  Need RNFA   HAND SURGERY Left    infected hand   SHOULDER SURGERY Left    dislocated collor bone   TOTAL HIP ARTHROPLASTY Left 01/18/2019   TOTAL HIP ARTHROPLASTY Left 01/18/2019   Procedure: LEFT TOTAL HIP ARTHROPLASTY ANTERIOR APPROACH;  Surgeon: Tarry Kos, MD;  Location: [facility] OR;  Service: Orthopedics;  Laterality: Left;   Social History   Occupational History   Not on file  Tobacco Use   Smoking status: Never   Smokeless tobacco: Never  Vaping Use   Vaping Use: Never used  Substance and Sexual Activity   Alcohol use: Not Currently   Drug use: Never   Sexual activity: Not on file

## 2021-02-27 ENCOUNTER — Telehealth: Payer: Self-pay

## 2021-02-27 ENCOUNTER — Ambulatory Visit: Payer: Medicare PPO | Admitting: Orthopaedic Surgery

## 2021-02-27 ENCOUNTER — Other Ambulatory Visit: Payer: Self-pay

## 2021-02-27 ENCOUNTER — Encounter: Payer: Self-pay | Admitting: Orthopaedic Surgery

## 2021-02-27 VITALS — Ht 71.0 in | Wt 282.0 lb

## 2021-02-27 DIAGNOSIS — M1712 Unilateral primary osteoarthritis, left knee: Secondary | ICD-10-CM

## 2021-02-27 DIAGNOSIS — M1611 Unilateral primary osteoarthritis, right hip: Secondary | ICD-10-CM | POA: Diagnosis not present

## 2021-02-27 NOTE — Telephone Encounter (Signed)
Noted  

## 2021-02-27 NOTE — Progress Notes (Signed)
Office Visit Note   Patient: Dylan [name]           Date of Birth: 11/17/1960           MRN: [id number] Visit Date: 02/27/2021              Requested by: Vanessa Vineyard, FNP [address] [place],  [place] [address] PCP: Vanessa Coahoma, FNP   Assessment & Plan: Visit Diagnoses:  1. Primary osteoarthritis of right hip   2. Primary osteoarthritis of left knee     Plan: In terms of the right hip DJD the surgery is scheduled for our September 12.  All questions answered regarding to the surgery.  In terms of the left knee DJD unfortunately the cortisone injection was unsuccessful in providing him with long-term relief therefore he would like to explore [place] injections as the next treatment.  We will get authorization for this and call him once that has occurred.  This patient is diagnosed with osteoarthritis of the knee(s).    Radiographs show evidence of joint space narrowing, osteophytes, subchondral sclerosis and/or subchondral cysts.  This patient has knee pain which interferes with functional and activities of daily living.    This patient has experienced inadequate response, adverse effects and/or intolerance with conservative treatments such as acetaminophen, NSAIDS, topical creams, physical therapy or regular exercise, knee bracing and/or weight loss.   This patient has experienced inadequate response or has a contraindication to intra articular steroid injections for at least 3 months.   This patient is not scheduled to have a total knee replacement within 6 months of starting treatment with viscosupplementation.  Follow-Up Instructions: No follow-ups on file.   Orders:  No orders of the defined types were placed in this encounter.  No orders of the defined types were placed in this encounter.     Procedures: No procedures performed   Clinical Data: No additional findings.   Subjective: Chief Complaint  Patient presents with   Left Hip - Pain     [name] returns today for follow-up of right hip DJD as well as left knee DJD.  His right hip replacement is scheduled for September.  We injected his left knee with cortisone 2 weeks ago and he is following up for this.  He feels like he only had about a day of relief.  Fortunately the pain and the discomfort and achiness returned the following day.   Review of Systems   Objective: Vital Signs: Ht 5\' 11"  (1.803 m)   Wt 282 lb (127.9 kg)   BMI 39.33 kg/m   Physical Exam  Ortho Exam Right hip and left knee exams are unchanged. Specialty Comments:  No specialty comments available.  Imaging: No results found.   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 No pertinent family history.  Past Surgical History:  Procedure Laterality Date   ANTERIOR HIP REVISION Left 03/11/2019   Procedure: LEFT HIP IRRIGATION AND DEBRIDEMENT, HEAD AND LINER EXCHANGE;  Surgeon: Tarry Kos, MD;  Location: [facility] OR;  Service: Orthopedics;  Laterality: Left;  Need RNFA   HAND SURGERY Left    infected hand   SHOULDER SURGERY Left    dislocated collor bone   TOTAL HIP ARTHROPLASTY Left 01/18/2019    TOTAL HIP ARTHROPLASTY Left 01/18/2019   Procedure: LEFT TOTAL HIP ARTHROPLASTY ANTERIOR APPROACH;  Surgeon: Tarry Kos, MD;  Location: [facility] OR;  Service: Orthopedics;  Laterality: Left;   Social History   Occupational History   Not on file  Tobacco Use   Smoking status: Never   Smokeless tobacco: Never  Vaping Use   Vaping Use: Never used  Substance and Sexual Activity   Alcohol use: Not Currently   Drug use: Never   Sexual activity: Not on file

## 2021-02-27 NOTE — Telephone Encounter (Signed)
Please submit for left knee gel injection 

## 2021-03-06 ENCOUNTER — Other Ambulatory Visit: Payer: Self-pay

## 2021-03-28 ENCOUNTER — Telehealth: Payer: Self-pay | Admitting: Orthopaedic Surgery

## 2021-03-28 NOTE — Telephone Encounter (Signed)
Returned call to patient's wife to schedule for gel injection, but she was wanting to know if patient could do his lab work the same day as gel injection.  Stated that she would check about lab work first, then give [place] a call back.

## 2021-03-28 NOTE — Telephone Encounter (Signed)
Pts wife Rosey Bath called on his behalf wanting to know if he was approved for his Gel injection? They would like a CB with an update please and states if they don't answer to LVM.   [phone]

## 2021-04-03 ENCOUNTER — Telehealth: Payer: Self-pay | Admitting: Orthopaedic Surgery

## 2021-04-03 NOTE — Telephone Encounter (Signed)
Pt called back and was wanting to schedule that gel injection now. Can we go ahead and start the process for that?   CB [phone]

## 2021-04-03 NOTE — Telephone Encounter (Signed)
Error

## 2021-04-03 NOTE — Telephone Encounter (Signed)
Please see below.

## 2021-04-04 ENCOUNTER — Telehealth: Payer: Self-pay

## 2021-04-04 NOTE — Telephone Encounter (Signed)
Approved for Monovisc, left knee. Buy & Bill Patient will be responsible for 20% OOP. Co-pay of $35.00 [place] Approval# [id number] Valid 04/04/2021- 07/03/2021

## 2021-04-04 NOTE — Telephone Encounter (Signed)
Called and left a VM for patient to CB to verify upcoming appointment for gel injection.

## 2021-04-10 ENCOUNTER — Ambulatory Visit: Payer: Medicare PPO | Admitting: Orthopaedic Surgery

## 2021-04-11 NOTE — Pre-Procedure Instructions (Signed)
Keon Pender [facility]  04/11/2021     Your procedure is scheduled on Mon., Sept. 19, 2022 from 9:44AM-11:43AM.  Report to [facility] Entrance "A" at 7:45AM  Call this number if you have problems the morning of surgery:  [phone]   Remember:  Do not eat after midnight on Sept. 18th  You may drink clear liquids until 3 hours (6:45AM) prior to surgery time .  Clear liquids allowed are: Water, Juice(no pulp, non-citric), Black Coffee(no dairy or creamer), Clear Tea(no dairy or creamer), Carbonated Beverages, Gatorade, Plain Jell-O, Plain Popsicles.   Enhanced Recovery after Surgery for Orthopedics Enhanced Recovery after Surgery is a protocol used to improve the stress on your body and your recovery after surgery.  Patient Instructions  The day of surgery (if you do NOT have diabetes):  Drink ONE (1) Pre-Surgery Clear Ensure by __6:45___ am the morning of surgery   This drink was given to you during your hospital  pre-op appointment visit. Nothing else to drink after completing the  Pre-Surgery Clear Ensure.         If you have questions, please contact your surgeon's office.     Take these medicines the morning of surgery with A SIP OF WATER: Gabapentin (NEURONTIN) Pravastatin (PRAVACHOL  If Needed: Acetaminophen (TYLENOL)   Follow your surgeon's instructions on when to stop Aspirin.  If no instructions were given by your surgeon then you will need to call the office to get those instructions.    As of today, STOP taking all Aspirin (unless instructed by your doctor) and Other Aspirin containing products, Vitamins, Fish oils, and Herbal medications. Also stop all NSAIDS i.e. Advil, Ibuprofen, Motrin, Aleve, Anaprox, Naproxen, BC, Goody Powders, and all Supplements.   No Smoking of any kind, Tobacco/Vaping, or Alcohol products 24 hours prior to your procedure. If you use a Cpap at night, you may bring all equipment for your overnight stay.     Day of Surgery:  Do not  wear jewelry.  Do not wear lotions, powders, colognes, or deodorant.  Do not shave 48 hours prior to surgery.  Men may shave face and neck.  Do not bring valuables to the hospital.  [facility] is not responsible for any belongings or valuables.  Contacts, dentures or bridgework may not be worn into surgery.    For patients admitted to the hospital, discharge time will be determined by your treatment team.  Patients discharged the day of surgery will not be allowed to drive home, and someone age 39 and over needs to stay with them for 24 hours.  Oral Hygiene is also important to reduce your risk of infection.  Remember - BRUSH YOUR TEETH THE MORNING OF SURGERY WITH YOUR REGULAR TOOTHPASTE  Special instructions:  [place]- Preparing For Surgery  Before surgery, you can play an important role. Because skin is not sterile, your skin needs to be as free of germs as possible. You can reduce the number of germs on your skin by washing with CHG (chlorahexidine gluconate) Soap before surgery.  CHG is an antiseptic cleaner which kills germs and bonds with the skin to continue killing germs even after washing.    Please do not use if you have an allergy to CHG or antibacterial soaps. If your skin becomes reddened/irritated stop using the CHG.  Do not shave (including legs and underarms) for at least 48 hours prior to first CHG shower. It is OK to shave your face.  Please follow these  instructions carefully.   Shower the NIGHT BEFORE SURGERY and the MORNING OF SURGERY with CHG.   If you chose to wash your hair, wash your hair first as usual with your normal shampoo.  After you shampoo, rinse your hair and body thoroughly to remove the shampoo.  Use CHG as you would any other liquid soap. You can apply CHG directly to the skin and wash gently with a scrungie or a clean washcloth.   Apply the CHG Soap to your body ONLY FROM THE NECK DOWN.  Do not use on open wounds or open sores. Avoid contact  with your eyes, ears, mouth and genitals (private parts). Wash Face and genitals (private parts)  with your normal soap.  Wash thoroughly, paying special attention to the area where your surgery will be performed.  Thoroughly rinse your body with warm water from the neck down.  DO NOT shower/wash with your normal soap after using and rinsing off the CHG Soap.  Pat yourself dry with a CLEAN TOWEL.  Wear CLEAN PAJAMAS to bed the night before surgery, wear comfortable clothes the morning of surgery  Place CLEAN SHEETS on your bed the night of your first shower and DO NOT SLEEP WITH PETS.  Reminders: Do not apply any deodorants/lotions.  Please wear clean clothes to the hospital/surgery center.   Remember to brush your teeth WITH YOUR REGULAR TOOTHPASTE.  Please read over the following fact sheets that you were given.

## 2021-04-12 ENCOUNTER — Encounter (HOSPITAL_COMMUNITY): Payer: Self-pay

## 2021-04-12 ENCOUNTER — Other Ambulatory Visit: Payer: Self-pay

## 2021-04-12 ENCOUNTER — Other Ambulatory Visit: Payer: Self-pay | Admitting: Physician Assistant

## 2021-04-12 ENCOUNTER — Encounter (HOSPITAL_COMMUNITY)
Admission: RE | Admit: 2021-04-12 | Discharge: 2021-04-12 | Disposition: A | Discharge status: Home / Self Care | Payer: Medicare PPO | Source: Ambulatory Visit | Attending: Orthopaedic Surgery | Admitting: Orthopaedic Surgery

## 2021-04-12 ENCOUNTER — Ambulatory Visit (INDEPENDENT_AMBULATORY_CARE_PROVIDER_SITE_OTHER): Payer: Medicare PPO | Admitting: Orthopaedic Surgery

## 2021-04-12 ENCOUNTER — Encounter: Payer: Self-pay | Admitting: Orthopaedic Surgery

## 2021-04-12 VITALS — Ht 71.0 in | Wt 282.0 lb

## 2021-04-12 DIAGNOSIS — M1712 Unilateral primary osteoarthritis, left knee: Secondary | ICD-10-CM | POA: Diagnosis not present

## 2021-04-12 DIAGNOSIS — Z20822 Contact with and (suspected) exposure to covid-19: Secondary | ICD-10-CM | POA: Diagnosis not present

## 2021-04-12 DIAGNOSIS — Z01818 Encounter for other preprocedural examination: Secondary | ICD-10-CM | POA: Insufficient documentation

## 2021-04-12 HISTORY — DX: Headache, unspecified: R51.9

## 2021-04-12 LAB — URINALYSIS, ROUTINE W REFLEX MICROSCOPIC
Bilirubin Urine: NEGATIVE
Glucose, UA: NEGATIVE mg/dL
Hgb urine dipstick: NEGATIVE
Ketones, ur: NEGATIVE mg/dL
Leukocytes,Ua: NEGATIVE
Nitrite: NEGATIVE
Protein, ur: NEGATIVE mg/dL
Specific Gravity, Urine: 1.02 (ref 1.005–1.030)
pH: 6 (ref 5.0–8.0)

## 2021-04-12 LAB — COMPREHENSIVE METABOLIC PANEL
ALT: 23 U/L (ref 0–44)
AST: 25 U/L (ref 15–41)
Albumin: 4 g/dL (ref 3.5–5.0)
Alkaline Phosphatase: 81 U/L (ref 38–126)
Anion gap: 8 (ref 5–15)
BUN: 17 mg/dL (ref 8–23)
CO2: 27 mmol/L (ref 22–32)
Calcium: 9.2 mg/dL (ref 8.9–10.3)
Chloride: 104 mmol/L (ref 98–111)
Creatinine, Ser: 1.13 mg/dL (ref 0.61–1.24)
GFR, Estimated: >60 mL/min (ref >60)
Glucose, Bld: 97 mg/dL (ref 70–99)
Potassium: 4.1 mmol/L (ref 3.5–5.1)
Sodium: 139 mmol/L (ref 135–145)
Total Bilirubin: 1.2 mg/dL (ref 0.3–1.2)
Total Protein: 7.1 g/dL (ref 6.5–8.1)

## 2021-04-12 LAB — SURGICAL PCR SCREEN
MRSA, PCR: NEGATIVE
Staphylococcus aureus: POSITIVE — AB

## 2021-04-12 LAB — CBC WITH DIFFERENTIAL/PLATELET
Abs Immature Granulocytes: 0.01 10*3/uL (ref 0.00–0.07)
Basophils Absolute: 0.1 10*3/uL (ref 0.0–0.1)
Basophils Relative: 1 %
Eosinophils Absolute: 0.3 10*3/uL (ref 0.0–0.5)
Eosinophils Relative: 4 %
HCT: 44.9 % (ref 39.0–52.0)
Hemoglobin: 13.7 g/dL (ref 13.0–17.0)
Immature Granulocytes: 0 %
Lymphocytes Relative: 29 %
Lymphs Abs: 2.2 10*3/uL (ref 0.7–4.0)
MCH: 26.7 pg (ref 26.0–34.0)
MCHC: 30.5 g/dL (ref 30.0–36.0)
MCV: 87.4 fL (ref 80.0–100.0)
Monocytes Absolute: 0.6 10*3/uL (ref 0.1–1.0)
Monocytes Relative: 9 %
Neutro Abs: 4.2 10*3/uL (ref 1.7–7.7)
Neutrophils Relative %: 57 %
Platelets: 245 10*3/uL (ref 150–400)
RBC: 5.14 MIL/uL (ref 4.22–5.81)
RDW: 14.7 % (ref 11.5–15.5)
WBC: 7.4 10*3/uL (ref 4.0–10.5)
nRBC: 0 % (ref 0.0–0.2)

## 2021-04-12 LAB — APTT: aPTT: 31 seconds (ref 24–36)

## 2021-04-12 LAB — PROTIME-INR
INR: 1 (ref 0.8–1.2)
Prothrombin Time: 13.1 seconds (ref 11.4–15.2)

## 2021-04-12 LAB — SARS CORONAVIRUS 2 (TAT 6-24 HRS): SARS Coronavirus 2: NEGATIVE

## 2021-04-12 MED ORDER — OXYCODONE-ACETAMINOPHEN 5-325 MG PO TABS: 1.0000 | ORAL_TABLET | Freq: Four times a day (QID) | ORAL | 0 refills | Status: DC | PRN

## 2021-04-12 MED ORDER — HYALURONAN 88 MG/4ML IX SOSY
88.0000 mg | PREFILLED_SYRINGE | INTRA_ARTICULAR | Status: AC | PRN
  Administered 2021-04-12: 88 mg via INTRA_ARTICULAR

## 2021-04-12 MED ORDER — METHOCARBAMOL 500 MG PO TABS: 500.0000 mg | ORAL_TABLET | Freq: Two times a day (BID) | ORAL | 2 refills | Status: AC | PRN

## 2021-04-12 MED ORDER — ONDANSETRON HCL 4 MG PO TABS: 4.0000 mg | ORAL_TABLET | Freq: Three times a day (TID) | ORAL | 0 refills | Status: AC | PRN

## 2021-04-12 MED ORDER — ASPIRIN EC 81 MG PO TBEC: 81.0000 mg | DELAYED_RELEASE_TABLET | Freq: Two times a day (BID) | ORAL | 0 refills | Status: AC

## 2021-04-12 MED ORDER — DOCUSATE SODIUM 100 MG PO CAPS: 100.0000 mg | ORAL_CAPSULE | Freq: Every day | ORAL | 2 refills | Status: AC | PRN

## 2021-04-12 NOTE — Progress Notes (Signed)
Lets put him on doxycycline for a month after surgery.  Thanks

## 2021-04-12 NOTE — Progress Notes (Addendum)
PCP - Randal B[facility], [facility] Cardiologist - denies  Chest x-ray - n/a EKG - 04/12/21  SA - yes, does not wear CPAP  Aspirin Instructions: follow your surgeon's instructions on when to stop your ASA  ERAS Protcol - yes, Ensure ordered & given   COVID TEST- 04/12/21   Anesthesia review: yes, EKG Revonda Standard notified at PAT appointment  Patient denies shortness of breath, fever, cough and chest pain at PAT appointment   All instructions explained to the patient, with a verbal understanding of the material. Patient agrees to go over the instructions while at home for a better understanding. Patient also instructed to self quarantine after being tested for COVID-19. The opportunity to ask questions was provided.

## 2021-04-12 NOTE — Pre-Procedure Instructions (Addendum)
Dylan [name]  04/12/2021     Your procedure is scheduled on Mon., Sept. 19, 2022   Report to [name] Entrance "A" at 5:30AM  Call this number if you have problems the morning of surgery:  [phone]   Remember:  Do not eat after midnight on Sept. 18th   You may drink clear liquids until 3 hours (4:15AM) prior to surgery time .  Clear liquids allowed are: Water, Juice(no pulp, non-citric), Black Coffee(no dairy or creamer), Clear Tea(no dairy or creamer), Carbonated Beverages, Gatorade, Plain Jell-O, Plain Popsicles.   Enhanced Recovery after Surgery for Orthopedics Enhanced Recovery after Surgery is a protocol used to improve the stress on your body and your recovery after surgery.  Patient Instructions  The day of surgery (if you do NOT have diabetes):  Drink ONE (1) Pre-Surgery Clear Ensure by __4:15___ am the morning of surgery   This drink was given to you during your [name]  pre-op appointment visit. Nothing else to drink after completing the  Pre-Surgery Clear Ensure.         If you have questions, please contact your surgeon's office.     Take these medicines the morning of surgery with A SIP OF WATER: Gabapentin (NEURONTIN) Pravastatin (PRAVACHOL  If Needed: Acetaminophen (TYLENOL)   Follow your surgeon's instructions on when to stop Aspirin.  If no instructions were given by your surgeon then you will need to call the office to get those instructions.    As of today, STOP taking all Aspirin (unless instructed by your doctor) and Other Aspirin containing products, Vitamins, Fish oils, and Herbal medications. Also stop all NSAIDS i.e. Advil, Ibuprofen, Motrin, Aleve, Anaprox, Naproxen, BC, Goody Powders, and all Supplements.   No Smoking of any kind, Tobacco/Vaping, or Alcohol products 24 hours prior to your procedure. If you use a Cpap at night, you may bring all equipment for your overnight stay.     Day of Surgery:  Do not wear jewelry.  Do  not wear lotions, powders, colognes, or deodorant.  Men may shave face and neck.  Do not bring valuables to the [name].  [facility] is not responsible for any belongings or valuables.  Contacts, dentures or bridgework may not be worn into surgery.    For patients admitted to the [name], discharge time will be determined by your treatment team.  Patients discharged the day of surgery will not be allowed to drive home, and someone age 89 and over needs to stay with them for 24 hours.  Oral Hygiene is also important to reduce your risk of infection.  Remember - BRUSH YOUR TEETH THE MORNING OF SURGERY WITH YOUR REGULAR TOOTHPASTE  Special instructions:  [facility]- Preparing For Surgery  Before surgery, you can play an important role. Because skin is not sterile, your skin needs to be as free of germs as possible. You can reduce the number of germs on your skin by washing with CHG (chlorahexidine gluconate) Soap before surgery.  CHG is an antiseptic cleaner which kills germs and bonds with the skin to continue killing germs even after washing.    Please do not use if you have an allergy to CHG or antibacterial soaps. If your skin becomes reddened/irritated stop using the CHG.  Do not shave (including legs and underarms) for at least 48 hours prior to first CHG shower. It is OK to shave your face.  Please follow these instructions carefully.   Shower the [employer] BEFORE SURGERY  and the MORNING OF SURGERY with CHG.   If you chose to wash your hair, wash your hair first as usual with your normal shampoo.  After you shampoo, rinse your hair and body thoroughly to remove the shampoo.  Use CHG as you would any other liquid soap. You can apply CHG directly to the skin and wash gently with a scrungie or a clean washcloth.   Apply the CHG Soap to your body ONLY FROM THE NECK DOWN.  Do not use on open wounds or open sores. Avoid contact with your eyes, ears, mouth and genitals (private parts).  Wash Face and genitals (private parts)  with your normal soap.  Wash thoroughly, paying special attention to the area where your surgery will be performed.  Thoroughly rinse your body with warm water from the neck down.  DO NOT shower/wash with your normal soap after using and rinsing off the CHG Soap.  Pat yourself dry with a CLEAN TOWEL.  Wear CLEAN PAJAMAS to bed the night before surgery, wear comfortable clothes the morning of surgery  Place CLEAN SHEETS on your bed the night of your first shower and DO NOT SLEEP WITH PETS.  Reminders: Do not apply any deodorants/lotions.  Please wear clean clothes to the [name]/surgery center.   Remember to brush your teeth WITH YOUR REGULAR TOOTHPASTE.  Please read over the following fact sheets that you were given.

## 2021-04-12 NOTE — Progress Notes (Signed)
Office Visit Note   Patient: Dylan [name]           Date of Birth: 1960/09/03           MRN: [id number] Visit Date: 04/12/2021              Requested by: Vanessa Lonsdale, FNP [address] [place],  [place] [address] PCP: Vanessa Winnebago, FNP   Assessment & Plan: Visit Diagnoses:  1. Primary osteoarthritis of left knee     Plan: Left knee injected with Monovisc today.  Tolerated well.  Follow-up as needed.  Follow-Up Instructions: No follow-ups on file.   Orders:  No orders of the defined types were placed in this encounter.  No orders of the defined types were placed in this encounter.     Procedures: Large Joint Inj: L knee on 04/12/2021 9:35 AM Indications: pain Details: 22 G needle  Arthrogram: No  Medications: 88 mg Hyaluronan 88 MG/4ML Outcome: tolerated well, no immediate complications Patient was prepped and draped in the usual sterile fashion.      Clinical Data: No additional findings.   Subjective: Chief Complaint  Patient presents with  . Left Knee - Follow-up    Monovisc injection    HPI  Dylan [name] is here today for left knee Monovisc injection.  Review of Systems   Objective: Vital Signs: Ht 5\' 11"  (1.803 m)   Wt 282 lb (127.9 kg)   BMI 39.33 kg/m   Physical Exam  Ortho Exam  Left knee exam stable.  Specialty Comments:  No specialty comments available.  Imaging: No results found.   PMFS History: Patient Active Problem List   Diagnosis Date Noted  . Primary osteoarthritis of right hip 06/02/2020  . Status post total hip replacement, left 06/02/2020  . Suspected COVID-19 virus infection 06/07/2019  . MSSA (methicillin susceptible Staphylococcus aureus) infection 04/14/2019  . HTN (hypertension) 04/14/2019  . 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 Primary osteoarthritis of left hip 01/18/2019  . Status post total replacement of left hip 01/18/2019   Past Medical History:  Diagnosis Date  .  Anxiety   . Arthritis   . Depression   . History of kidney stones   . HTN (hypertension) 04/14/2019  . Hypertension   . MSSA (methicillin susceptible Staphylococcus aureus) infection 04/14/2019  . Neck pain   . Sleep apnea    to start CPAP after surgery  . Suspected COVID-19 virus infection 06/07/2019    No family history on file.  Past Surgical History:  Procedure Laterality Date  . ANTERIOR HIP REVISION Left 03/11/2019   Procedure: LEFT HIP IRRIGATION AND DEBRIDEMENT, HEAD AND LINER EXCHANGE;  Surgeon: 03/13/2019, MD;  Location: [facility] OR;  Service: Orthopedics;  Laterality: Left;  Need RNFA  . HAND SURGERY Left    infected hand  . SHOULDER SURGERY Left    dislocated collor bone  . TOTAL HIP ARTHROPLASTY Left 01/18/2019  . TOTAL HIP ARTHROPLASTY Left 01/18/2019   Procedure: LEFT TOTAL HIP ARTHROPLASTY ANTERIOR APPROACH;  Surgeon: 01/20/2019, MD;  Location: [facility] OR;  Service: Orthopedics;  Laterality: Left;   Social History   Occupational History  . Not on file  Tobacco Use  . Smoking status: Never  . Smokeless tobacco: Never  Vaping Use  . Vaping Use: Never used  Substance and Sexual Activity  . Alcohol use: Not Currently  . Drug use: Never  . Sexual activity: Not on file

## 2021-04-13 ENCOUNTER — Other Ambulatory Visit: Payer: Self-pay | Admitting: Physician Assistant

## 2021-04-13 MED ORDER — TRANEXAMIC ACID 1000 MG/10ML IV SOLN
2000.0000 mg | INTRAVENOUS | Status: DC
Start: 2021-04-13 — End: 2021-04-14
  Filled 2021-04-13: qty 20

## 2021-04-13 MED ORDER — DOXYCYCLINE MONOHYDRATE 100 MG PO TABS: 100.0000 mg | ORAL_TABLET | Freq: Two times a day (BID) | ORAL | 0 refills | Status: AC

## 2021-04-13 NOTE — Progress Notes (Signed)
Sent in

## 2021-04-15 NOTE — Anesthesia Preprocedure Evaluation (Addendum)
Anesthesia Evaluation  Patient identified by MRN, date of birth, ID band Patient awake    Reviewed: Allergy & Precautions, H&P , NPO status , Patient's Chart, lab work & pertinent test results  Airway Mallampati: III  TM Distance: >3 FB Neck ROM: Full    Dental no notable dental hx. (+) Edentulous Upper, Missing, Dental Advisory Given, Poor Dentition,    Pulmonary sleep apnea ,    Pulmonary exam normal breath sounds clear to auscultation       Cardiovascular Exercise Tolerance: Good hypertension, Pt. on medications negative cardio ROS Normal cardiovascular exam Rhythm:Regular Rate:Normal     Neuro/Psych  Headaches, PSYCHIATRIC DISORDERS Anxiety Depression    GI/Hepatic negative GI ROS, Neg liver ROS,   Endo/Other  negative endocrine ROS  Renal/GU negative Renal ROS  negative genitourinary   Musculoskeletal  (+) Arthritis , Osteoarthritis,    Abdominal   Peds negative pediatric ROS (+)  Hematology negative hematology ROS (+)   Anesthesia Other Findings   Reproductive/Obstetrics negative OB ROS                            Anesthesia Physical Anesthesia Plan  ASA: 3  Anesthesia Plan: MAC and Spinal   Post-op Pain Management:    Induction: Intravenous  PONV Risk Score and Plan: Midazolam and Propofol infusion  Airway Management Planned: Natural Airway, Nasal Cannula, Nasal ETT and Simple Face Mask  Additional Equipment: None  Intra-op Plan:   Post-operative Plan:   Informed Consent: I have reviewed the patients History and Physical, chart, labs and discussed the procedure including the risks, benefits and alternatives for the proposed anesthesia with the patient or authorized representative who has indicated his/her understanding and acceptance.       Plan Discussed with: Anesthesiologist and CRNA  Anesthesia Plan Comments: (  )        Anesthesia Quick  Evaluation

## 2021-04-16 ENCOUNTER — Ambulatory Visit (HOSPITAL_COMMUNITY): Payer: Medicare PPO | Admitting: Vascular Surgery

## 2021-04-16 ENCOUNTER — Observation Stay (HOSPITAL_COMMUNITY): Payer: Medicare PPO

## 2021-04-16 ENCOUNTER — Ambulatory Visit (HOSPITAL_COMMUNITY): Payer: Medicare PPO | Admitting: Certified Registered Nurse Anesthetist

## 2021-04-16 ENCOUNTER — Ambulatory Visit (HOSPITAL_COMMUNITY): Payer: Medicare PPO

## 2021-04-16 ENCOUNTER — Encounter (HOSPITAL_COMMUNITY)
Admission: RE | Disposition: A | Discharge status: Home / Self Care | Payer: Self-pay | Source: Ambulatory Visit | Attending: Orthopaedic Surgery

## 2021-04-16 ENCOUNTER — Other Ambulatory Visit: Payer: Self-pay

## 2021-04-16 ENCOUNTER — Encounter (HOSPITAL_COMMUNITY): Payer: Self-pay | Admitting: Orthopaedic Surgery

## 2021-04-16 ENCOUNTER — Observation Stay (HOSPITAL_COMMUNITY)
Admission: RE | Admit: 2021-04-16 | Discharge: 2021-04-17 | Disposition: A | Discharge status: Home / Self Care | Payer: Medicare PPO | Source: Ambulatory Visit | Attending: Orthopaedic Surgery | Admitting: Orthopaedic Surgery

## 2021-04-16 DIAGNOSIS — Z7982 Long term (current) use of aspirin: Secondary | ICD-10-CM | POA: Insufficient documentation

## 2021-04-16 DIAGNOSIS — I10 Essential (primary) hypertension: Secondary | ICD-10-CM | POA: Diagnosis not present

## 2021-04-16 DIAGNOSIS — Z419 Encounter for procedure for purposes other than remedying health state, unspecified: Secondary | ICD-10-CM

## 2021-04-16 DIAGNOSIS — Z96641 Presence of right artificial hip joint: Secondary | ICD-10-CM

## 2021-04-16 DIAGNOSIS — M1611 Unilateral primary osteoarthritis, right hip: Secondary | ICD-10-CM | POA: Diagnosis present

## 2021-04-16 DIAGNOSIS — Z96642 Presence of left artificial hip joint: Secondary | ICD-10-CM | POA: Diagnosis not present

## 2021-04-16 DIAGNOSIS — Z79899 Other long term (current) drug therapy: Secondary | ICD-10-CM | POA: Diagnosis not present

## 2021-04-16 DIAGNOSIS — Z96649 Presence of unspecified artificial hip joint: Secondary | ICD-10-CM

## 2021-04-16 HISTORY — PX: TOTAL HIP ARTHROPLASTY: SHX124

## 2021-04-16 SURGERY — ARTHROPLASTY, HIP, TOTAL, ANTERIOR APPROACH
Anesthesia: Monitor Anesthesia Care | Site: Hip | Laterality: Right

## 2021-04-16 MED ORDER — LACTATED RINGERS IV SOLN: INTRAVENOUS | Status: DC

## 2021-04-16 MED ORDER — BUPIVACAINE-MELOXICAM ER 400-12 MG/14ML IJ SOLN
INTRAMUSCULAR | Status: AC
  Filled 2021-04-16: qty 1

## 2021-04-16 MED ORDER — MEPERIDINE HCL 25 MG/ML IJ SOLN: 6.2500 mg | INTRAMUSCULAR | Status: DC | PRN

## 2021-04-16 MED ORDER — OXYCODONE HCL ER 10 MG PO T12A
10.0000 mg | EXTENDED_RELEASE_TABLET | Freq: Two times a day (BID) | ORAL | Status: DC
  Administered 2021-04-16 – 2021-04-17 (×3): 10 mg via ORAL
  Filled 2021-04-16 (×3): qty 1

## 2021-04-16 MED ORDER — ACETAMINOPHEN 500 MG PO TABS
1000.0000 mg | ORAL_TABLET | Freq: Four times a day (QID) | ORAL | Status: AC
  Administered 2021-04-16 – 2021-04-17 (×4): 1000 mg via ORAL
  Filled 2021-04-16 (×4): qty 2

## 2021-04-16 MED ORDER — DEXAMETHASONE SODIUM PHOSPHATE 10 MG/ML IJ SOLN
INTRAMUSCULAR | Status: AC
  Filled 2021-04-16: qty 1

## 2021-04-16 MED ORDER — ACETAMINOPHEN 325 MG PO TABS: 325.0000 mg | ORAL_TABLET | Freq: Four times a day (QID) | ORAL | Status: DC | PRN

## 2021-04-16 MED ORDER — SORBITOL 70 % SOLN: 30.0000 mL | Freq: Every day | Status: DC | PRN

## 2021-04-16 MED ORDER — BUPIVACAINE LIPOSOME 1.3 % IJ SUSP
INTRAMUSCULAR | Status: AC
  Filled 2021-04-16: qty 20

## 2021-04-16 MED ORDER — PROPOFOL 10 MG/ML IV BOLUS
INTRAVENOUS | Status: DC | PRN
  Administered 2021-04-16: 10 mg via INTRAVENOUS
  Administered 2021-04-16: 20 mg via INTRAVENOUS

## 2021-04-16 MED ORDER — POVIDONE-IODINE 10 % EX SWAB
2.0000 "application " | Freq: Once | CUTANEOUS | Status: AC
  Administered 2021-04-16: 2 via TOPICAL

## 2021-04-16 MED ORDER — OXYCODONE HCL 5 MG PO TABS
10.0000 mg | ORAL_TABLET | ORAL | Status: DC | PRN
  Administered 2021-04-17: 10 mg via ORAL

## 2021-04-16 MED ORDER — PHENYLEPHRINE 40 MCG/ML (10ML) SYRINGE FOR IV PUSH (FOR BLOOD PRESSURE SUPPORT)
PREFILLED_SYRINGE | INTRAVENOUS | Status: AC
  Filled 2021-04-16: qty 10

## 2021-04-16 MED ORDER — ONDANSETRON HCL 4 MG PO TABS: 4.0000 mg | ORAL_TABLET | Freq: Four times a day (QID) | ORAL | Status: DC | PRN

## 2021-04-16 MED ORDER — POLYETHYLENE GLYCOL 3350 17 G PO PACK
17.0000 g | PACK | Freq: Every day | ORAL | Status: DC
  Administered 2021-04-16 – 2021-04-17 (×2): 17 g via ORAL
  Filled 2021-04-16 (×2): qty 1

## 2021-04-16 MED ORDER — ONDANSETRON HCL 4 MG/2ML IJ SOLN
INTRAMUSCULAR | Status: AC
  Filled 2021-04-16: qty 2

## 2021-04-16 MED ORDER — PROPOFOL 10 MG/ML IV BOLUS
INTRAVENOUS | Status: AC
  Filled 2021-04-16: qty 40

## 2021-04-16 MED ORDER — TRANEXAMIC ACID-NACL 1000-0.7 MG/100ML-% IV SOLN
1000.0000 mg | Freq: Once | INTRAVENOUS | Status: AC
  Administered 2021-04-16: 1000 mg via INTRAVENOUS
  Filled 2021-04-16: qty 100

## 2021-04-16 MED ORDER — CHLORHEXIDINE GLUCONATE 0.12 % MT SOLN
15.0000 mL | Freq: Once | OROMUCOSAL | Status: AC
  Administered 2021-04-16: 15 mL via OROMUCOSAL
  Filled 2021-04-16: qty 15

## 2021-04-16 MED ORDER — ALBUMIN HUMAN 5 % IV SOLN: INTRAVENOUS | Status: DC | PRN

## 2021-04-16 MED ORDER — OXYCODONE HCL 5 MG/5ML PO SOLN: 5.0000 mg | Freq: Once | ORAL | Status: DC | PRN

## 2021-04-16 MED ORDER — FENTANYL CITRATE (PF) 250 MCG/5ML IJ SOLN
INTRAMUSCULAR | Status: AC
  Filled 2021-04-16: qty 5

## 2021-04-16 MED ORDER — DOCUSATE SODIUM 100 MG PO CAPS
100.0000 mg | ORAL_CAPSULE | Freq: Two times a day (BID) | ORAL | Status: DC
  Administered 2021-04-16 – 2021-04-17 (×3): 100 mg via ORAL
  Filled 2021-04-16 (×3): qty 1

## 2021-04-16 MED ORDER — HYDROMORPHONE HCL 1 MG/ML IJ SOLN: 0.5000 mg | INTRAMUSCULAR | Status: DC | PRN

## 2021-04-16 MED ORDER — FENTANYL CITRATE (PF) 100 MCG/2ML IJ SOLN
25.0000 ug | INTRAMUSCULAR | Status: DC | PRN
  Administered 2021-04-16: 50 ug via INTRAVENOUS

## 2021-04-16 MED ORDER — MIDAZOLAM HCL 2 MG/2ML IJ SOLN
INTRAMUSCULAR | Status: AC
  Filled 2021-04-16: qty 2

## 2021-04-16 MED ORDER — METHOCARBAMOL 1000 MG/10ML IJ SOLN
500.0000 mg | Freq: Four times a day (QID) | INTRAVENOUS | Status: DC | PRN
  Filled 2021-04-16: qty 5

## 2021-04-16 MED ORDER — ACETAMINOPHEN 160 MG/5ML PO SOLN: 325.0000 mg | ORAL | Status: DC | PRN

## 2021-04-16 MED ORDER — IRRISEPT - 450ML BOTTLE WITH 0.05% CHG IN STERILE WATER, USP 99.95% OPTIME
TOPICAL | Status: DC | PRN
  Administered 2021-04-16: 450 mL via TOPICAL

## 2021-04-16 MED ORDER — PHENOL 1.4 % MT LIQD: 1.0000 | OROMUCOSAL | Status: DC | PRN

## 2021-04-16 MED ORDER — PHENYLEPHRINE 40 MCG/ML (10ML) SYRINGE FOR IV PUSH (FOR BLOOD PRESSURE SUPPORT)
PREFILLED_SYRINGE | INTRAVENOUS | Status: DC | PRN
  Administered 2021-04-16 (×2): 80 ug via INTRAVENOUS
  Administered 2021-04-16 (×3): 40 ug via INTRAVENOUS
  Administered 2021-04-16 (×3): 80 ug via INTRAVENOUS

## 2021-04-16 MED ORDER — CEFAZOLIN IN SODIUM CHLORIDE 3-0.9 GM/100ML-% IV SOLN
3.0000 g | INTRAVENOUS | Status: AC
  Administered 2021-04-16: 3 g via INTRAVENOUS
  Filled 2021-04-16: qty 100

## 2021-04-16 MED ORDER — ONDANSETRON HCL 4 MG/2ML IJ SOLN: 4.0000 mg | Freq: Once | INTRAMUSCULAR | Status: DC | PRN

## 2021-04-16 MED ORDER — LACTATED RINGERS IV SOLN: INTRAVENOUS | Status: DC | PRN

## 2021-04-16 MED ORDER — MENTHOL 3 MG MT LOZG: 1.0000 | LOZENGE | OROMUCOSAL | Status: DC | PRN

## 2021-04-16 MED ORDER — BUPIVACAINE IN DEXTROSE 0.75-8.25 % IT SOLN
INTRATHECAL | Status: DC | PRN
  Administered 2021-04-16: 1.8 mL via INTRATHECAL

## 2021-04-16 MED ORDER — LIDOCAINE 2% (20 MG/ML) 5 ML SYRINGE
INTRAMUSCULAR | Status: AC
  Filled 2021-04-16: qty 5

## 2021-04-16 MED ORDER — ACETAMINOPHEN 325 MG PO TABS: 325.0000 mg | ORAL_TABLET | ORAL | Status: DC | PRN

## 2021-04-16 MED ORDER — FENTANYL CITRATE (PF) 100 MCG/2ML IJ SOLN
INTRAMUSCULAR | Status: AC
  Administered 2021-04-16: 50 ug via INTRAVENOUS
  Filled 2021-04-16: qty 2

## 2021-04-16 MED ORDER — SODIUM CHLORIDE 0.9 % IR SOLN
Status: DC | PRN
  Administered 2021-04-16: 3000 mL

## 2021-04-16 MED ORDER — TRANEXAMIC ACID 1000 MG/10ML IV SOLN
INTRAVENOUS | Status: DC | PRN
  Administered 2021-04-16: 2000 mg via TOPICAL

## 2021-04-16 MED ORDER — HYDROMORPHONE HCL 1 MG/ML IJ SOLN
INTRAMUSCULAR | Status: AC
  Administered 2021-04-16: 0.5 mg via INTRAVENOUS
  Filled 2021-04-16: qty 1

## 2021-04-16 MED ORDER — ASPIRIN 81 MG PO CHEW
81.0000 mg | CHEWABLE_TABLET | Freq: Two times a day (BID) | ORAL | Status: DC
  Administered 2021-04-16 – 2021-04-17 (×2): 81 mg via ORAL
  Filled 2021-04-16 (×2): qty 1

## 2021-04-16 MED ORDER — OXYCODONE HCL 5 MG PO TABS: 5.0000 mg | ORAL_TABLET | Freq: Once | ORAL | Status: DC | PRN

## 2021-04-16 MED ORDER — PHENYLEPHRINE HCL-NACL 20-0.9 MG/250ML-% IV SOLN
INTRAVENOUS | Status: DC | PRN
  Administered 2021-04-16: 25 ug/min via INTRAVENOUS

## 2021-04-16 MED ORDER — CEFAZOLIN SODIUM-DEXTROSE 2-4 GM/100ML-% IV SOLN
2.0000 g | Freq: Four times a day (QID) | INTRAVENOUS | Status: AC
  Administered 2021-04-16 (×2): 2 g via INTRAVENOUS
  Filled 2021-04-16 (×2): qty 100

## 2021-04-16 MED ORDER — METOCLOPRAMIDE HCL 5 MG/ML IJ SOLN
5.0000 mg | Freq: Three times a day (TID) | INTRAMUSCULAR | Status: DC | PRN
Start: 2021-04-16 — End: 2021-04-17

## 2021-04-16 MED ORDER — OXYCODONE HCL 5 MG PO TABS
5.0000 mg | ORAL_TABLET | ORAL | Status: DC | PRN
  Administered 2021-04-16: 10 mg via ORAL
  Filled 2021-04-16 (×2): qty 2

## 2021-04-16 MED ORDER — SODIUM CHLORIDE 0.9 % IV SOLN: INTRAVENOUS | Status: DC

## 2021-04-16 MED ORDER — ONDANSETRON HCL 4 MG/2ML IJ SOLN: 4.0000 mg | Freq: Four times a day (QID) | INTRAMUSCULAR | Status: DC | PRN

## 2021-04-16 MED ORDER — OXYCODONE HCL 5 MG PO TABS
ORAL_TABLET | ORAL | Status: AC
  Administered 2021-04-16: 5 mg via ORAL
  Filled 2021-04-16: qty 1

## 2021-04-16 MED ORDER — ONDANSETRON HCL 4 MG/2ML IJ SOLN
INTRAMUSCULAR | Status: DC | PRN
  Administered 2021-04-16: 4 mg via INTRAVENOUS

## 2021-04-16 MED ORDER — VANCOMYCIN HCL 1000 MG IV SOLR
INTRAVENOUS | Status: AC
  Filled 2021-04-16: qty 20

## 2021-04-16 MED ORDER — METHOCARBAMOL 500 MG PO TABS
500.0000 mg | ORAL_TABLET | Freq: Four times a day (QID) | ORAL | Status: DC | PRN
  Administered 2021-04-16 – 2021-04-17 (×3): 500 mg via ORAL
  Filled 2021-04-16 (×3): qty 1

## 2021-04-16 MED ORDER — ORAL CARE MOUTH RINSE: 15.0000 mL | Freq: Once | OROMUCOSAL | Status: AC

## 2021-04-16 MED ORDER — ALUM & MAG HYDROXIDE-SIMETH 200-200-20 MG/5ML PO SUSP: 30.0000 mL | ORAL | Status: DC | PRN

## 2021-04-16 MED ORDER — EPHEDRINE SULFATE-NACL 50-0.9 MG/10ML-% IV SOSY
PREFILLED_SYRINGE | INTRAVENOUS | Status: DC | PRN
  Administered 2021-04-16: 5 mg via INTRAVENOUS

## 2021-04-16 MED ORDER — 0.9 % SODIUM CHLORIDE (POUR BTL) OPTIME
TOPICAL | Status: DC | PRN
  Administered 2021-04-16: 1000 mL

## 2021-04-16 MED ORDER — EPHEDRINE 5 MG/ML INJ
INTRAVENOUS | Status: AC
  Filled 2021-04-16: qty 5

## 2021-04-16 MED ORDER — DEXAMETHASONE SODIUM PHOSPHATE 10 MG/ML IJ SOLN
10.0000 mg | Freq: Once | INTRAMUSCULAR | Status: AC
  Administered 2021-04-17: 10 mg via INTRAVENOUS
  Filled 2021-04-16: qty 1

## 2021-04-16 MED ORDER — PROPOFOL 500 MG/50ML IV EMUL
INTRAVENOUS | Status: DC | PRN
  Administered 2021-04-16: 100 ug/kg/min via INTRAVENOUS

## 2021-04-16 MED ORDER — VANCOMYCIN HCL 1 G IV SOLR
INTRAVENOUS | Status: DC | PRN
  Administered 2021-04-16: 1000 mg via TOPICAL

## 2021-04-16 MED ORDER — MIDAZOLAM HCL 5 MG/5ML IJ SOLN
INTRAMUSCULAR | Status: DC | PRN
Start: 2021-04-16 — End: 2021-04-16
  Administered 2021-04-16: 1 mg via INTRAVENOUS
  Administered 2021-04-16 (×2): .5 mg via INTRAVENOUS

## 2021-04-16 MED ORDER — ROCURONIUM BROMIDE 10 MG/ML (PF) SYRINGE
PREFILLED_SYRINGE | INTRAVENOUS | Status: AC
  Filled 2021-04-16: qty 10

## 2021-04-16 MED ORDER — TRANEXAMIC ACID 1000 MG/10ML IV SOLN
2000.0000 mg | INTRAVENOUS | Status: DC
  Filled 2021-04-16: qty 20

## 2021-04-16 MED ORDER — METOCLOPRAMIDE HCL 5 MG PO TABS
5.0000 mg | ORAL_TABLET | Freq: Three times a day (TID) | ORAL | Status: DC | PRN
Start: 2021-04-16 — End: 2021-04-17

## 2021-04-16 MED ORDER — METHOCARBAMOL 500 MG PO TABS
ORAL_TABLET | ORAL | Status: AC
  Administered 2021-04-16: 500 mg via ORAL
  Filled 2021-04-16: qty 1

## 2021-04-16 MED ORDER — DIPHENHYDRAMINE HCL 12.5 MG/5ML PO ELIX
25.0000 mg | ORAL_SOLUTION | ORAL | Status: DC | PRN
  Filled 2021-04-16: qty 10

## 2021-04-16 MED ORDER — HYDROMORPHONE HCL 1 MG/ML IJ SOLN
0.5000 mg | INTRAMUSCULAR | Status: DC | PRN
  Administered 2021-04-16: 0.5 mg via INTRAVENOUS

## 2021-04-16 MED ORDER — PANTOPRAZOLE SODIUM 40 MG PO TBEC
40.0000 mg | DELAYED_RELEASE_TABLET | Freq: Every day | ORAL | Status: DC
  Administered 2021-04-16 – 2021-04-17 (×2): 40 mg via ORAL
  Filled 2021-04-16 (×2): qty 1

## 2021-04-16 MED ORDER — TRANEXAMIC ACID-NACL 1000-0.7 MG/100ML-% IV SOLN
1000.0000 mg | INTRAVENOUS | Status: AC
  Administered 2021-04-16: 1000 mg via INTRAVENOUS
  Filled 2021-04-16: qty 100

## 2021-04-16 SURGICAL SUPPLY — 61 items
ACETAB CUP W/GRIPTION 54 (Plate) ×2 IMPLANT
BAG COUNTER SPONGE SURGICOUNT (BAG) ×2 IMPLANT
BAG DECANTER FOR FLEXI CONT (MISCELLANEOUS) ×2 IMPLANT
CELLS DAT CNTRL 66122 CELL SVR (MISCELLANEOUS) IMPLANT
COVER PERINEAL POST (MISCELLANEOUS) ×2 IMPLANT
COVER SURGICAL LIGHT HANDLE (MISCELLANEOUS) ×2 IMPLANT
CUP ACETAB W/GRIPTION 54 (Plate) ×1 IMPLANT
DRAPE C-ARM 42X72 X-RAY (DRAPES) ×2 IMPLANT
DRAPE POUCH INSTRU U-SHP 10X18 (DRAPES) ×2 IMPLANT
DRAPE STERI IOBAN 125X83 (DRAPES) ×2 IMPLANT
DRAPE U-SHAPE 47X51 STRL (DRAPES) ×4 IMPLANT
DRESSING PEEL AND PLAC PRVNA20 (GAUZE/BANDAGES/DRESSINGS) ×1 IMPLANT
DRSG AQUACEL AG ADV 3.5X10 (GAUZE/BANDAGES/DRESSINGS) ×2 IMPLANT
DRSG PEEL AND PLACE PREVENA 20 (GAUZE/BANDAGES/DRESSINGS) ×2
DURAPREP 26ML APPLICATOR (WOUND CARE) ×4 IMPLANT
ELECT BLADE 4.0 EZ CLEAN MEGAD (MISCELLANEOUS) ×2
ELECT REM PT RETURN 9FT ADLT (ELECTROSURGICAL) ×2
ELECTRODE BLDE 4.0 EZ CLN MEGD (MISCELLANEOUS) ×1 IMPLANT
ELECTRODE REM PT RTRN 9FT ADLT (ELECTROSURGICAL) ×1 IMPLANT
GLOVE SURG LTX SZ7 (GLOVE) ×4 IMPLANT
GLOVE SURG NEOP MICRO LF SZ7.5 (GLOVE) ×2 IMPLANT
GLOVE SURG SYN 7.5  E (GLOVE) ×4
GLOVE SURG SYN 7.5 E (GLOVE) ×4 IMPLANT
GLOVE SURG UNDER POLY LF SZ7 (GLOVE) ×10 IMPLANT
GOWN STRL REIN XL XLG (GOWN DISPOSABLE) ×2 IMPLANT
GOWN STRL REUS W/ TWL LRG LVL3 (GOWN DISPOSABLE) IMPLANT
GOWN STRL REUS W/ TWL XL LVL3 (GOWN DISPOSABLE) ×1 IMPLANT
GOWN STRL REUS W/TWL LRG LVL3 (GOWN DISPOSABLE)
GOWN STRL REUS W/TWL XL LVL3 (GOWN DISPOSABLE) ×1
HANDPIECE INTERPULSE COAX TIP (DISPOSABLE) ×1
HEAD CERAMIC 36 PLUS 8.5 12 14 (Hips) ×2 IMPLANT
HOOD PEEL AWAY FLYTE STAYCOOL (MISCELLANEOUS) ×4 IMPLANT
IV NS IRRIG 3000ML ARTHROMATIC (IV SOLUTION) ×2 IMPLANT
JET LAVAGE IRRISEPT WOUND (IRRIGATION / IRRIGATOR) ×2
KIT BASIN OR (CUSTOM PROCEDURE TRAY) ×2 IMPLANT
KIT DRSG PREVENA PLUS 7DAY 125 (MISCELLANEOUS) ×2 IMPLANT
LAVAGE JET IRRISEPT WOUND (IRRIGATION / IRRIGATOR) ×1 IMPLANT
LINER NEUTRAL 54X36MM PLUS 4 (Hips) ×2 IMPLANT
MARKER SKIN DUAL TIP RULER LAB (MISCELLANEOUS) ×2 IMPLANT
NEEDLE SPNL 18GX3.5 QUINCKE PK (NEEDLE) ×2 IMPLANT
PACK TOTAL JOINT (CUSTOM PROCEDURE TRAY) ×2 IMPLANT
PACK UNIVERSAL I (CUSTOM PROCEDURE TRAY) ×2 IMPLANT
RTRCTR WOUND ALEXIS 18CM MED (MISCELLANEOUS)
SAW OSC TIP CART 19.5X105X1.3 (SAW) ×2 IMPLANT
SCREW 6.5MMX25MM (Screw) ×2 IMPLANT
SET HNDPC FAN SPRY TIP SCT (DISPOSABLE) ×1 IMPLANT
STAPLER VISISTAT 35W (STAPLE) IMPLANT
STEM FEMORAL SZ5 HIGH ACTIS (Stem) ×2 IMPLANT
SUT ETHIBOND 2 V 37 (SUTURE) ×2 IMPLANT
SUT VIC AB 0 CT1 27 (SUTURE) ×1
SUT VIC AB 0 CT1 27XBRD ANBCTR (SUTURE) ×1 IMPLANT
SUT VIC AB 1 CTX 36 (SUTURE) ×1
SUT VIC AB 1 CTX36XBRD ANBCTR (SUTURE) ×1 IMPLANT
SUT VIC AB 2-0 CT1 27 (SUTURE) ×2
SUT VIC AB 2-0 CT1 TAPERPNT 27 (SUTURE) ×2 IMPLANT
SYR 50ML LL SCALE MARK (SYRINGE) ×2 IMPLANT
TOWEL GREEN STERILE (TOWEL DISPOSABLE) ×2 IMPLANT
TRAY CATH 16FR W/PLASTIC CATH (SET/KITS/TRAYS/PACK) IMPLANT
TRAY FOLEY W/BAG SLVR 16FR (SET/KITS/TRAYS/PACK) ×1
TRAY FOLEY W/BAG SLVR 16FR ST (SET/KITS/TRAYS/PACK) ×1 IMPLANT
YANKAUER SUCT BULB TIP NO VENT (SUCTIONS) ×2 IMPLANT

## 2021-04-16 NOTE — Discharge Instructions (Signed)

## 2021-04-16 NOTE — Evaluation (Signed)
Physical Therapy Evaluation Patient Details Name: Dylan [name] MRN: [id number] DOB: 27-Feb-1961 Today's Date: 04/16/2021  History of Present Illness  Pt is a 60 y/o male s/p R THA, direct anterior on 9/19. PMH includes HTN and s/p L THA and revision.  Clinical Impression  Pt s/p surgery above with deficits below. Mobility limited to chair this session secondary to nausea. Required min to min guard A for mobility using RW. Anticipate pt will progress well once feeling better. Reports wife can assist as needed. Will continue to follow acutely.        Recommendations for follow up therapy are one component of a multi-disciplinary discharge planning process, led by the attending physician.  Recommendations may be updated based on patient status, additional functional criteria and insurance authorization.  Follow Up Recommendations Follow surgeon's recommendation for DC plan and follow-up therapies    Equipment Recommendations  None recommended by PT    Recommendations for Other Services       Precautions / Restrictions Precautions Precautions: Fall Restrictions Weight Bearing Restrictions: Yes RLE Weight Bearing: Weight bearing as tolerated      Mobility  Bed Mobility Overal bed mobility: Needs Assistance Bed Mobility: Supine to Sit     Supine to sit: Min assist     General bed mobility comments: Min A for RLE assist.    Transfers Overall transfer level: Needs assistance Equipment used: Rolling walker (2 wheeled) Transfers: Sit to/from [employer] Sit to Stand: Min assist Stand pivot transfers: Min guard       General transfer comment: Min A for steadying assist to stand. Cues for hand placement. Min guard for safety to transfer to chair. Pt reporting nausea, so further mobility deferred.  Ambulation/Gait                Stairs            Wheelchair Mobility    Modified Rankin (Stroke Patients Only)       Balance Overall balance  assessment: Needs assistance Sitting-balance support: No upper extremity supported;Feet supported Sitting balance-Leahy Scale: Good     Standing balance support: Bilateral upper extremity supported;During functional activity Standing balance-Leahy Scale: Poor Standing balance comment: Reliant on BUE support                             Pertinent Vitals/Pain Pain Assessment: Faces Faces Pain Scale: Hurts even more Pain Location: R hip Pain Descriptors / Indicators: Aching;Operative site guarding Pain Intervention(s): Limited activity within patient's tolerance;Monitored during session    Home Living Family/patient expects to be discharged to:: Private residence Living Arrangements: Spouse/significant other Available Help at Discharge: Family;Available 24 hours/day Type of Home: House Home Access: Stairs to enter Entrance Stairs-Rails: Doctor, general practice of Steps: 5 Home Layout: One level Home Equipment: Environmental consultant - 2 wheels      Prior Function Level of Independence: Independent               Hand Dominance        Extremity/Trunk Assessment   Upper Extremity Assessment Upper Extremity Assessment: Overall WFL for tasks assessed    Lower Extremity Assessment Lower Extremity Assessment: RLE deficits/detail RLE Deficits / Details: Deficits consistent with post op pain and weakness.    Cervical / Trunk Assessment Cervical / Trunk Assessment: Normal  Communication   Communication: No difficulties  Cognition Arousal/Alertness: Awake/alert Behavior During Therapy: WFL for tasks assessed/performed Overall Cognitive Status: Within Functional Limits  for tasks assessed                                        General Comments      Exercises Total Joint Exercises Ankle Circles/Pumps: AROM;Both;10 reps;Seated   Assessment/Plan    PT Assessment Patient needs continued PT services  PT Problem List Decreased strength;Decreased  activity tolerance;Decreased balance;Decreased mobility;Decreased knowledge of use of DME;Pain       PT Treatment Interventions DME instruction;Gait training;Functional mobility training;Stair training;Therapeutic activities;Therapeutic exercise;Balance training;Patient/family education    PT Goals (Current goals can be found in the Care Plan section)  Acute Rehab PT Goals Patient Stated Goal: to go home PT Goal Formulation: With patient Time For Goal Achievement: 04/30/21 Potential to Achieve Goals: Good    Frequency 7X/week   Barriers to discharge        Co-evaluation               AM-PAC PT "6 Clicks" Mobility  Outcome Measure Help needed turning from your back to your side while in a flat bed without using bedrails?: A Little Help needed moving from lying on your back to sitting on the side of a flat bed without using bedrails?: A Little Help needed moving to and from a bed to a chair (including a wheelchair)?: A Little Help needed standing up from a chair using your arms (e.g., wheelchair or bedside chair)?: A Little Help needed to walk in hospital room?: A Little Help needed climbing 3-5 steps with a railing? : A Lot 6 Click Score: 17    End of Session Equipment Utilized During Treatment: Gait belt Activity Tolerance: Treatment limited secondary to medical complications (Comment) (nausea) Patient left: in chair;with call bell/phone within reach Nurse Communication: Mobility status PT Visit Diagnosis: Other abnormalities of gait and mobility (R26.89);Pain Pain - Right/Left: Right Pain - part of body: Hip    Time: 5573-2202 PT Time Calculation (min) (ACUTE ONLY): 15 min   Charges:   PT Evaluation $PT Eval Low Complexity: 1 Low          Cindee Salt, DPT  Acute Rehabilitation Services  Pager: [phone] Office: [phone]   Lehman Prom 04/16/2021, 3:38 PM

## 2021-04-16 NOTE — Anesthesia Procedure Notes (Signed)
Procedure Name: MAC Date/Time: 04/16/2021 7:20 AM Performed by: Janene Harvey, CRNA Pre-anesthesia Checklist: Patient identified, Emergency Drugs available, Suction available and Patient being monitored Patient Re-evaluated:Patient Re-evaluated prior to induction Oxygen Delivery Method: Simple face mask Induction Type: IV induction Placement Confirmation: positive ETCO2 Dental Injury: Teeth and Oropharynx as per pre-operative assessment

## 2021-04-16 NOTE — H&P (Signed)
PREOPERATIVE H&P  Chief Complaint: right hip degenerative joint disease  HPI: Dylan [name] is a 60 y.o. male who presents for surgical treatment of right hip degenerative joint disease.  He denies any changes in medical history.  Past Medical History:  Diagnosis Date   Anxiety    Arthritis    Depression    Headache    History of kidney stones    HTN (hypertension) 04/14/2019   Hypertension    MSSA (methicillin susceptible Staphylococcus aureus) infection 04/14/2019   Neck pain    Sleep apnea    to start CPAP after surgery   Suspected COVID-19 virus infection 06/07/2019   Past Surgical History:  Procedure Laterality Date   ANTERIOR HIP REVISION Left 03/11/2019   Procedure: LEFT HIP IRRIGATION AND DEBRIDEMENT, HEAD AND LINER EXCHANGE;  Surgeon: Tarry Kos, MD;  Location: [facility] OR;  Service: Orthopedics;  Laterality: Left;  Need RNFA   HAND SURGERY Left    infected hand   SHOULDER SURGERY Left    dislocated collor bone   TOTAL HIP ARTHROPLASTY Left 01/18/2019   TOTAL HIP ARTHROPLASTY Left 01/18/2019   Procedure: LEFT TOTAL HIP ARTHROPLASTY ANTERIOR APPROACH;  Surgeon: Tarry Kos, MD;  Location: [facility] OR;  Service: Orthopedics;  Laterality: Left;   Social History   Socioeconomic History   Marital status: Married    Spouse name: Not on file   Number of children: Not on file   Years of education: Not on file   Highest education level: Not on file  Occupational History   Not on file  Tobacco Use   Smoking status: Never   Smokeless tobacco: Never  Vaping Use   Vaping Use: Never used  Substance and Sexual Activity   Alcohol use: Not Currently   Drug use: Never   Sexual activity: Not on file  Other Topics Concern   Not on file  Social History Narrative   Not on file   Social Determinants of Health   Financial Resource Strain: Not on file  Food Insecurity: Not on file  Transportation Needs: Not on file  Physical Activity: Not on file  Stress: Not on file   Social Connections: Not on file   History reviewed. No pertinent family history. No Known Allergies Prior to Admission medications   Medication Sig Start Date End Date Taking? Authorizing Provider  acetaminophen (TYLENOL) 500 MG tablet Take 1,000-1,500 mg by mouth every 6 (six) hours as needed for moderate pain or headache.   Yes [provider]  amLODipine (NORVASC) 10 MG tablet Take 10 mg by mouth every evening.  11/26/18  Yes [provider]  diclofenac Sodium (VOLTAREN) 1 % GEL Apply 1 application topically 4 (four) times daily as needed (pain).   Yes [provider]  DULoxetine (CYMBALTA) 30 MG capsule Take 30 mg by mouth every evening.  12/28/18  Yes [provider]  gabapentin (NEURONTIN) 400 MG capsule Take 400 mg by mouth 4 (four) times daily.   Yes [provider]  ibuprofen (ADVIL) 200 MG tablet Take 400 mg by mouth 3 (three) times daily as needed for headache or moderate pain.   Yes [provider]  losartan (COZAAR) 50 MG tablet Take 50 mg by mouth daily.   Yes [provider]  pravastatin (PRAVACHOL) 40 MG tablet Take 40 mg by mouth daily.  10/28/18  Yes [provider]  aspirin EC 81 MG tablet Take 1 tablet (81 mg total) by mouth 2 (two)  times daily. To be taken after surgery 04/12/21   Cristie Hem, PA-C  docusate sodium (COLACE) 100 MG capsule Take 1 capsule (100 mg total) by mouth daily as needed. 04/12/21 04/12/22  Cristie Hem, PA-C  doxycycline (ADOXA) 100 MG tablet Take 1 tablet (100 mg total) by mouth 2 (two) times daily. To be taken after surgery 04/13/21   Cristie Hem, PA-C  methocarbamol (ROBAXIN) 500 MG tablet Take 1 tablet (500 mg total) by mouth 2 (two) times daily as needed. 04/12/21   Cristie Hem, PA-C  ondansetron (ZOFRAN) 4 MG tablet Take 1 tablet (4 mg total) by mouth every 8 (eight) hours as needed for nausea or vomiting. 04/12/21   Cristie Hem, PA-C  oxyCODONE-acetaminophen  (PERCOCET) 5-325 MG tablet Take 1-2 tablets by mouth every 6 (six) hours as needed. To be taken after surgery 04/12/21   Cristie Hem, PA-C  rifampin (RIFADIN) 300 MG capsule Take 1 capsule (300 mg total) by mouth 2 (two) times daily. 04/14/19   Randall Hiss, MD     Positive ROS: All other systems have been reviewed and were otherwise negative with the exception of those mentioned in the HPI and as above.  Physical Exam: General: Alert, no acute distress Cardiovascular: No pedal edema Respiratory: No cyanosis, no use of accessory musculature GI: abdomen soft Skin: No lesions in the area of chief complaint Neurologic: Sensation intact distally Psychiatric: Patient is competent for consent with normal mood and affect Lymphatic: no lymphedema  MUSCULOSKELETAL: exam stable  Assessment: right hip degenerative joint disease  Plan: Plan for Procedure(s): RIGTH TOTAL HIP ARTHROPLASTY ANTERIOR APPROACH  The risks benefits and alternatives were discussed with the patient including but not limited to the risks of nonoperative treatment, versus surgical intervention including infection, bleeding, nerve injury,  blood clots, cardiopulmonary complications, morbidity, mortality, among others, and they were willing to proceed.   Preoperative templating of the joint replacement has been completed, documented, and submitted to the Operating Room personnel in order to optimize intra-operative equipment management.   Glee Arvin, MD 04/16/2021 7:01 AM

## 2021-04-16 NOTE — Op Note (Signed)
RIGTH TOTAL HIP ARTHROPLASTY ANTERIOR APPROACH  Procedure Note Dylan [name]   [id number]  Pre-op Diagnosis: right hip degenerative joint disease     Post-op Diagnosis: same   Operative Procedures  1. Total hip replacement; Right hip; uncemented [id number]  2. Application of incisional VAC  Surgeon: Gershon Mussel, M.D.  Assist: Oneal Grout, PA-C   Anesthesia: spinal  Prosthesis: Depuy Acetabulum: Pinnacle 54 mm Femur: Actis 5 HO Head: 36 mm size: +8.5 Liner: +4 Bearing Type: ceramic/poly  Total Hip Arthroplasty (Anterior Approach) Op Note:  After informed consent was obtained and the operative extremity marked in the holding area, the patient was brought back to the operating room and placed supine on the HANA table. Next, the operative extremity was prepped and draped in normal sterile fashion. Surgical timeout occurred verifying patient identification, surgical site, surgical procedure and administration of antibiotics.  A modified anterior Smith-Peterson approach to the hip was performed, using the interval between tensor fascia lata and sartorius.  Dissection was carried bluntly down onto the anterior hip capsule. The lateral femoral circumflex vessels were identified and coagulated. A capsulotomy was performed and the capsular flaps tagged for later repair.  There was a large joint effusion.  The neck osteotomy was performed. The femoral head was removed which showed degenerative wear, the acetabular rim was cleared of soft tissue and attention was turned to reaming the acetabulum.  Sequential reaming was performed under fluoroscopic guidance. We reamed to a size 53 mm, and then impacted the acetabular shell. A 25 mm cancellous screw was placed through the shell for added fixation.  The liner was then placed after irrigation and attention turned to the femur.  After placing the femoral hook, the leg was taken to externally rotated, extended and adducted position taking  care to perform soft tissue releases to allow for adequate mobilization of the femur. Soft tissue was cleared from the shoulder of the greater trochanter and the hook elevator used to improve exposure of the proximal femur. Sequential broaching performed up to a size 5. Trial neck and head were placed. The leg was brought back up to neutral and the construct reduced.  Antibiotic irrigation was placed in the surgical wound and kept for at least 1 minute.  The position and sizing of components, offset and leg lengths were checked using fluoroscopy. Stability of the construct was checked in extension and external rotation without any subluxation or impingement of prosthesis. We dislocated the prosthesis, dropped the leg back into position, removed trial components, and irrigated copiously. The final stem and head was then placed, the leg brought back up, the system reduced and fluoroscopy used to verify positioning.  We irrigated, obtained hemostasis and closed the capsule using #2 ethibond suture.  One gram of vancomycin powder was placed in the surgical bed.   One gram of topical tranexamic acid was injected into the joint.  The fascia was closed with #1 vicryl plus, the deep fat layer was closed with 0 vicryl, the subcutaneous layers closed with 2.0 Vicryl Plus and the skin closed with 2.0 nylon and incisional VAC was placed.  The patient was awakened in the operating room and taken to recovery in stable condition.  All sponge, needle, and instrument counts were correct at the end of the case.   Tessa Lerner, my PA, was a medical necessity for opening, closing, limb positioning, retracting, exposing, and overall facilitation and timely completion of the surgery.  Position: supine  Complications: see description of procedure.  Time Out: performed   Drains/Packing: none  Estimated blood loss: see anesthesia record  Returned to Recovery Room: in good condition.   Antibiotics: yes   Mechanical VTE  (DVT) Prophylaxis: sequential compression devices, TED thigh-high  Chemical VTE (DVT) Prophylaxis: aspirin   Fluid Replacement: see anesthesia record  Specimens Removed: 1 to pathology   Sponge and Instrument Count Correct? yes   PACU: portable radiograph - low AP   Plan/RTC: Return in 2 weeks for staple removal. Weight Bearing/Load Lower Extremity: full  Hip precautions: none Suture Removal: 2 weeks   N. Glee Arvin, MD [facility] 8:53 AM   Implant Name Type Inv. Item Serial No. Manufacturer Lot No. LRB No. Used Action  ACETAB CUP Cathe Mons - [id number] Plate ACETAB CUP W GRIPTION  [employer] [id number] Right 1 Implanted  SCREW 6.5MMX25MM - [id number] Screw SCREW 6.5MMX25MM  [employer] [id number] Right 1 Implanted  LINER NEUTRAL 54X36MM PLUS 4 - [id number] Hips LINER NEUTRAL 54X36MM PLUS 4  [employer] [id number] Right 1 Implanted

## 2021-04-16 NOTE — Anesthesia Procedure Notes (Signed)
Spinal  Patient location during procedure: OR Start time: 04/16/2021 7:25 AM End time: 04/16/2021 7:30 AM Reason for block: surgical anesthesia Staffing Anesthesiologist: Bethena Midget, MD Preanesthetic Checklist Completed: patient identified, IV checked, site marked, risks and benefits discussed, surgical consent, monitors and equipment checked, pre-op evaluation and timeout performed Spinal Block Patient position: sitting Prep: DuraPrep Patient monitoring: heart rate, cardiac monitor, continuous pulse ox and blood pressure Approach: midline Location: L3-4 Injection technique: single-shot Needle Needle type: Sprotte  Needle gauge: 24 G Needle length: 9 cm Assessment Sensory level: T4 Events: CSF return

## 2021-04-16 NOTE — Transfer of Care (Signed)
Immediate Anesthesia Transfer of Care Note  Patient: Dylan [name]  Procedure(s) Performed: RIGTH TOTAL HIP ARTHROPLASTY ANTERIOR APPROACH (Right: Hip)  Patient Location: PACU  Anesthesia Type:MAC and Spinal  Level of Consciousness: drowsy and patient cooperative  Airway & Oxygen Therapy: Patient Spontanous Breathing and Patient connected to face mask oxygen  Post-op Assessment: Report given to RN and Post -op Vital signs reviewed and stable  Post vital signs: Reviewed  Last Vitals:  Vitals Value Taken Time  BP 108/72 04/16/21 0930  Temp    Pulse 65 04/16/21 0933  Resp 14 04/16/21 0933  SpO2 100 % 04/16/21 0933  Vitals shown include unvalidated device data.  Last Pain:  Vitals:   04/16/21 0615  TempSrc:   PainSc: 7       Patients Stated Pain Goal: 2 (81/77/11 6579)  Complications: No notable events documented.

## 2021-04-16 NOTE — Anesthesia Postprocedure Evaluation (Signed)
Anesthesia Post Note  Patient: Dylan [name]  Procedure(s) Performed: RIGTH TOTAL HIP ARTHROPLASTY ANTERIOR APPROACH (Right: Hip)     Patient location during evaluation: PACU Anesthesia Type: MAC Level of consciousness: awake and alert Pain management: pain level controlled Vital Signs Assessment: post-procedure vital signs reviewed and stable Respiratory status: spontaneous breathing, nonlabored ventilation, respiratory function stable and patient connected to nasal cannula oxygen Cardiovascular status: stable and blood pressure returned to baseline Postop Assessment: no apparent nausea or vomiting Anesthetic complications: no   No notable events documented.  Last Vitals:  Vitals:   04/16/21 1135 04/16/21 1203  BP: 138/85 135/72  Pulse: 68 82  Resp: 14 18  Temp: (!) 36.4 C 36.6 C  SpO2: 99% 98%    Last Pain:  Vitals:   04/16/21 1210  TempSrc:   PainSc: 3                  Yiselle Babich

## 2021-04-17 ENCOUNTER — Encounter (HOSPITAL_COMMUNITY): Payer: Self-pay | Admitting: Orthopaedic Surgery

## 2021-04-17 DIAGNOSIS — M1611 Unilateral primary osteoarthritis, right hip: Secondary | ICD-10-CM | POA: Diagnosis not present

## 2021-04-17 LAB — BASIC METABOLIC PANEL
Anion gap: 10 (ref 5–15)
BUN: 11 mg/dL (ref 8–23)
CO2: 27 mmol/L (ref 22–32)
Calcium: 8.7 mg/dL — ABNORMAL LOW (ref 8.9–10.3)
Chloride: 99 mmol/L (ref 98–111)
Creatinine, Ser: 1.08 mg/dL (ref 0.61–1.24)
GFR, Estimated: >60 mL/min (ref >60)
Glucose, Bld: 122 mg/dL — ABNORMAL HIGH (ref 70–99)
Potassium: 4.1 mmol/L (ref 3.5–5.1)
Sodium: 136 mmol/L (ref 135–145)

## 2021-04-17 LAB — CBC
HCT: 32.2 % — ABNORMAL LOW (ref 39.0–52.0)
Hemoglobin: 9.9 g/dL — ABNORMAL LOW (ref 13.0–17.0)
MCH: 26.9 pg (ref 26.0–34.0)
MCHC: 30.7 g/dL (ref 30.0–36.0)
MCV: 87.5 fL (ref 80.0–100.0)
Platelets: 148 10*3/uL — ABNORMAL LOW (ref 150–400)
RBC: 3.68 MIL/uL — ABNORMAL LOW (ref 4.22–5.81)
RDW: 14.7 % (ref 11.5–15.5)
WBC: 6.5 10*3/uL (ref 4.0–10.5)
nRBC: 0 % (ref 0.0–0.2)

## 2021-04-17 NOTE — Progress Notes (Signed)
Subjective: 1 Day Post-Op Procedure(s) (LRB): RIGTH TOTAL HIP ARTHROPLASTY ANTERIOR APPROACH (Right) Patient reports pain as mild.    Objective: Vital signs in last 24 hours: Temp:  [97.5 F (36.4 C)-99.3 F (37.4 C)] 99.3 F (37.4 C) (09/20 0743) Pulse Rate:  [61-84] 84 (09/20 0743) Resp:  [12-20] 18 (09/20 0743) BP: (105-138)/(60-85) 121/76 (09/20 0743) SpO2:  [92 %-100 %] 96 % (09/20 0743)  Intake/Output from previous day: 09/19 0701 - 09/20 0700 In: 2040 [P.O.:240; I.V.:1100; IV Piggyback:700] Out: 1950 [Urine:1600; Blood:350] Intake/Output this shift: No intake/output data recorded.  Recent Labs    04/17/21 0412  HGB 9.9*   Recent Labs    04/17/21 0412  WBC 6.5  RBC 3.68*  HCT 32.2*  PLT 148*   Recent Labs    04/17/21 0412  NA 136  K 4.1  CL 99  CO2 27  BUN 11  CREATININE 1.08  GLUCOSE 122*  CALCIUM 8.7*   No results for input(s): LABPT, INR in the last 72 hours.  Neurologically intact Neurovascular intact Sensation intact distally Intact pulses distally Dorsiflexion/Plantar flexion intact Incision: dressing C/D/I No cellulitis present Compartment soft Wound vac in place and functioning.  No fluid in canister   Assessment/Plan: 1 Day Post-Op Procedure(s) (LRB): RIGTH TOTAL HIP ARTHROPLASTY ANTERIOR APPROACH (Right) Advance diet Up with therapy D/C IV fluids Discharge home with home health WBAT RLE ABLA- mild and stable Discussed with nurse to swap out the home wound vac unit for the portable prevena just prior to d/c      Cristie Hem 04/17/2021, 7:48 AM

## 2021-04-17 NOTE — Progress Notes (Signed)
Patient was transported via wheelchair by volunteer for discharge home; in no acute distress nor complaints of pain nor discomfort; dressing on his right hip is clean, dry and intact with a portable wound vac attached to his incision to go home along with him; no drainage or output on the wound vac noted; room was checked and accounted for all his belongings; discharge instructions given to patient by RN and he verbalized understanding on the instructions given.

## 2021-04-17 NOTE — Progress Notes (Signed)
Physical Therapy Treatment Patient Details Name: Dylan [name] MRN: [id number] DOB: [birth year] Today's Date: 04/17/2021   History of Present Illness Pt is a 60 y/o male s/p R THA, direct anterior on 9/19. PMH includes HTN and s/p L THA and revision.    PT Comments    Pt made good progress towards his physical therapy goals during his inpatient stay. Session focused on reviewing HEP for ROM/strengthening (written handout provided), gait training and stair training prior to discharge home. Pt ambulating 100 feet with a walker and negotiated 3 steps with right railing via sideways technique. Continues to utilize step to pattern; instruction provided on progressing to step through pattern. Would benefit from additional follow up therapy to address deficits.     Recommendations for follow up therapy are one component of a multi-disciplinary discharge planning process, led by the attending physician.  Recommendations may be updated based on patient status, additional functional criteria and insurance authorization.  Follow Up Recommendations  Follow surgeon's recommendation for DC plan and follow-up therapies     Equipment Recommendations  None recommended by PT    Recommendations for Other Services       Precautions / Restrictions Precautions Precautions: Fall;Other (comment) Precaution Comments: Preveena wound vac Restrictions Weight Bearing Restrictions: No RLE Weight Bearing: Weight bearing as tolerated     Mobility  Bed Mobility               General bed mobility comments: OOB in chair    Transfers Overall transfer level: Modified independent Equipment used: Rolling walker (2 wheeled)                Ambulation/Gait Ambulation/Gait assistance: Modified independent (Device/Increase time) Gait Distance (Feet): 100 Feet Assistive device: Rolling walker (2 wheeled) Gait Pattern/deviations: Step-to pattern;Decreased stance time - right;Decreased dorsiflexion -  right;Decreased weight shift to right;Decreased step length - left;Antalgic Gait velocity: decreased   General Gait Details: Cues provided for walker use (rolling walker rather than picking it up), sequencing/technique, right heel strike, decreased right step length, walker proximity.   Stairs Stairs: Yes Stairs assistance: Min guard Stair Management: One rail Right;Sideways Number of Stairs: 3 General stair comments: Cues for sideways technique, ascending with LLE, descending with RLE, min guard overall for safety   Wheelchair Mobility    Modified Rankin (Stroke Patients Only)       Balance Overall balance assessment: Needs assistance Sitting-balance support: No upper extremity supported;Feet supported Sitting balance-Leahy Scale: Good     Standing balance support: Bilateral upper extremity supported;During functional activity Standing balance-Leahy Scale: Poor Standing balance comment: Reliant on BUE support                            Cognition Arousal/Alertness: Awake/alert Behavior During Therapy: WFL for tasks assessed/performed Overall Cognitive Status: Within Functional Limits for tasks assessed                                        Exercises General Exercises - Lower Extremity Quad Sets: Both;15 reps;Seated Long Arc Quad: Right;10 reps;Seated Hip ABduction/ADduction: Both;15 reps;Seated Toe Raises: Both;20 reps;Seated Heel Raises: Both;20 reps;Seated    General Comments        Pertinent Vitals/Pain Pain Assessment: Faces Faces Pain Scale: Hurts even more Pain Location: R hip Pain Descriptors / Indicators: Operative site guarding;Sore Pain Intervention(s): Monitored during session  Home Living                      Prior Function            PT Goals (current goals can now be found in the care plan section) Acute Rehab PT Goals Patient Stated Goal: to go home PT Goal Formulation: With patient Time For Goal  Achievement: 04/30/21 Potential to Achieve Goals: Good Progress towards PT goals: Progressing toward goals    Frequency    7X/week      PT Plan Current plan remains appropriate    Co-evaluation              AM-PAC PT "6 Clicks" Mobility   Outcome Measure  Help needed turning from your back to your side while in a flat bed without using bedrails?: None Help needed moving from lying on your back to sitting on the side of a flat bed without using bedrails?: None Help needed moving to and from a bed to a chair (including a wheelchair)?: None Help needed standing up from a chair using your arms (e.g., wheelchair or bedside chair)?: None Help needed to walk in hospital room?: A Little Help needed climbing 3-5 steps with a railing? : A Little 6 Click Score: 22    End of Session Equipment Utilized During Treatment: Gait belt Activity Tolerance: Patient tolerated treatment well Patient left: in chair;with call bell/phone within reach Nurse Communication: Mobility status PT Visit Diagnosis: Other abnormalities of gait and mobility (R26.89);Pain Pain - Right/Left: Right Pain - part of body: Hip     Time: 0812-0837 PT Time Calculation (min) (ACUTE ONLY): 25 min  Charges:  $Gait Training: 8-22 mins $Therapeutic Exercise: 8-22 mins                     Lillia Pauls, PT, DPT Acute Rehabilitation Services Pager [phone] Office [phone]    Norval Morton 04/17/2021, 10:03 AM

## 2021-04-17 NOTE — Discharge Summary (Signed)
Patient ID: Dylan [name] MRN: [id number] DOB/AGE: Dec 20, 1960 60 y.o.  Admit date: 04/16/2021 Discharge date: 04/17/2021  Admission Diagnoses:  Principal Problem:   Primary osteoarthritis of right hip Active Problems:   Status post total replacement of right hip   Discharge Diagnoses:  Same  Past Medical History:  Diagnosis Date   Anxiety    Arthritis    Depression    Headache    History of kidney stones    HTN (hypertension) 04/14/2019   Hypertension    MSSA (methicillin susceptible Staphylococcus aureus) infection 04/14/2019   Neck pain    Sleep apnea    to start CPAP after surgery   Suspected COVID-19 virus infection 06/07/2019    Surgeries: Procedure(s): RIGTH TOTAL HIP ARTHROPLASTY ANTERIOR APPROACH on 04/16/2021   Consultants:   Discharged Condition: Improved  Hospital Course: Dylan [name] is an 60 y.o. male who was admitted 04/16/2021 for operative treatment ofPrimary osteoarthritis of right hip. Patient has severe unremitting pain that affects sleep, daily activities, and work/hobbies. After pre-op clearance the patient was taken to the operating room on 04/16/2021 and underwent  Procedure(s): RIGTH TOTAL HIP ARTHROPLASTY ANTERIOR APPROACH.    Patient was given perioperative antibiotics:  Anti-infectives (From admission, onward)    Start     Dose/Rate Route Frequency Ordered Stop   04/16/21 1330  ceFAZolin (ANCEF) IVPB 2g/100 mL premix        2 g 200 mL/hr over 30 Minutes Intravenous Every 6 hours 04/16/21 1159 04/16/21 2044   04/16/21 0922  vancomycin (VANCOCIN) powder  Status:  Discontinued          As needed 04/16/21 0922 04/16/21 0924   04/16/21 0600  ceFAZolin (ANCEF) IVPB 3g/100 mL premix        3 g 200 mL/hr over 30 Minutes Intravenous On call to O.R. 04/16/21 0547 04/16/21 0740        Patient was given sequential compression devices, early ambulation, and chemoprophylaxis to prevent DVT.  Patient benefited maximally from hospital stay and  there were no complications.    Recent vital signs: Patient Vitals for the past 24 hrs:  BP Temp Temp src Pulse Resp SpO2  04/17/21 0743 121/76 99.3 F (37.4 C) Oral 84 18 96 %  04/17/21 0433 124/72 98.6 F (37 C) Oral 74 18 92 %  04/16/21 2258 109/66 98.6 F (37 C) Oral 71 20 97 %  04/16/21 1935 117/82 98.4 F (36.9 C) Oral 69 20 99 %  04/16/21 1634 105/60 98.4 F (36.9 C) Oral 61 18 95 %  04/16/21 1203 135/72 97.9 F (36.6 C) Oral 82 18 98 %  04/16/21 1135 138/85 (!) 97.5 F (36.4 C) -- 68 14 99 %  04/16/21 0945 114/83 -- -- 66 16 100 %  04/16/21 0930 108/72 97.9 F (36.6 C) -- 65 12 99 %     Recent laboratory studies:  Recent Labs    04/17/21 0412  WBC 6.5  HGB 9.9*  HCT 32.2*  PLT 148*  NA 136  K 4.1  CL 99  CO2 27  BUN 11  CREATININE 1.08  GLUCOSE 122*  CALCIUM 8.7*     Discharge Medications:   Allergies as of 04/17/2021   No Known Allergies      Medication List     STOP taking these medications    acetaminophen 500 MG tablet Commonly known as: TYLENOL   ibuprofen 200 MG tablet Commonly known as: ADVIL   rifampin 300 MG capsule Commonly known  as: Rifadin       TAKE these medications    amLODipine 10 MG tablet Commonly known as: NORVASC Take 10 mg by mouth every evening.   aspirin EC 81 MG tablet Take 1 tablet (81 mg total) by mouth 2 (two) times daily. To be taken after surgery   diclofenac Sodium 1 % Gel Commonly known as: VOLTAREN Apply 1 application topically 4 (four) times daily as needed (pain).   docusate sodium 100 MG capsule Commonly known as: Colace Take 1 capsule (100 mg total) by mouth daily as needed.   doxycycline 100 MG tablet Commonly known as: ADOXA Take 1 tablet (100 mg total) by mouth 2 (two) times daily. To be taken after surgery   DULoxetine 30 MG capsule Commonly known as: CYMBALTA Take 30 mg by mouth every evening.   gabapentin 400 MG capsule Commonly known as: NEURONTIN Take 400 mg by mouth 4 (four)  times daily.   losartan 50 MG tablet Commonly known as: COZAAR Take 50 mg by mouth daily.   methocarbamol 500 MG tablet Commonly known as: Robaxin Take 1 tablet (500 mg total) by mouth 2 (two) times daily as needed.   ondansetron 4 MG tablet Commonly known as: Zofran Take 1 tablet (4 mg total) by mouth every 8 (eight) hours as needed for nausea or vomiting.   oxyCODONE-acetaminophen 5-325 MG tablet Commonly known as: Percocet Take 1-2 tablets by mouth every 6 (six) hours as needed. To be taken after surgery   pravastatin 40 MG tablet Commonly known as: PRAVACHOL Take 40 mg by mouth daily.               Durable Medical Equipment  (From admission, onward)           Start     Ordered   04/16/21 1200  DME Walker rolling  Once       Question:  Patient needs a walker to treat with the following condition  Answer:  History of hip replacement   04/16/21 1159   04/16/21 1200  DME 3 n 1  Once        04/16/21 1159   04/16/21 1200  DME Bedside commode  Once       Question:  Patient needs a bedside commode to treat with the following condition  Answer:  History of hip replacement   04/16/21 1159            Diagnostic Studies: DG Pelvis Portable  Result Date: 04/16/2021 CLINICAL DATA:  Post right hip arthroplasty EXAM: PORTABLE PELVIS 1-2 VIEWS COMPARISON:  Intraoperative hip radiographs obtained earlier the same day, pelvis radiographs 02/13/2021 FINDINGS: There has been interval total right hip arthroplasty. Hardware alignment is within expected limits, without evidence of hardware related complication. Left hip arthroplasty hardware is also unchanged and within expected limits. The symphysis pubis is intact. IMPRESSION: Status post right hip arthroplasty without evidence of complication. Electronically Signed   By: Lesia Hausen M.D.   On: 04/16/2021 10:54   DG C-Arm 1-60 Min-No Report  Result Date: 04/16/2021 Fluoroscopy was utilized by the requesting physician.  No  radiographic interpretation.   DG C-Arm 1-60 Min-No Report  Result Date: 04/16/2021 Fluoroscopy was utilized by the requesting physician.  No radiographic interpretation.   DG HIP OPERATIVE UNILAT WITH PELVIS RIGHT  Result Date: 04/16/2021 CLINICAL DATA:  Right hip arthroplasty EXAM: OPERATIVE RIGHT HIP (WITH PELVIS IF PERFORMED) 1 VIEW TECHNIQUE: Fluoroscopic spot image(s) were submitted for interpretation post-operatively. COMPARISON:  None. FINDINGS:  Frontal images are submitted. Preoperative image demonstrates degenerative changes at the right hip. Postoperative image demonstrates right total hip arthroplasty with single acetabular screw. IMPRESSION: Post right total hip arthroplasty. Electronically Signed   By: Guadlupe Spanish M.D.   On: 04/16/2021 10:08    Disposition: Discharge disposition: 01-Home or Self Care          Follow-up Information     Tarry Kos, MD. Schedule an appointment as soon as possible for a visit in 1 week(s).   Specialty: Orthopedic Surgery Contact information: [address] [place] [address] [phone]                  Signed: Cristie Hem 04/17/2021, 7:49 AM

## 2021-04-27 ENCOUNTER — Encounter: Payer: Self-pay | Admitting: Orthopaedic Surgery

## 2021-04-27 ENCOUNTER — Ambulatory Visit (INDEPENDENT_AMBULATORY_CARE_PROVIDER_SITE_OTHER): Payer: Medicare PPO | Admitting: Physician Assistant

## 2021-04-27 ENCOUNTER — Other Ambulatory Visit: Payer: Self-pay

## 2021-04-27 VITALS — Ht 72.0 in | Wt 282.0 lb

## 2021-04-27 DIAGNOSIS — Z96641 Presence of right artificial hip joint: Secondary | ICD-10-CM

## 2021-04-27 NOTE — Progress Notes (Signed)
Post-Op Visit Note   Patient: Dylan [name]           Date of Birth: 30-Oct-1960           MRN: [id number] Visit Date: 04/27/2021 PCP: Vanessa Hermleigh, FNP   Assessment & Plan:  Chief Complaint:  Chief Complaint  Patient presents with   Right Hip - Routine Post Op    Right total hip arthroplasty 04/16/2021   Visit Diagnoses:  1. History of total replacement of right hip     Plan: Patient is a pleasant 60 year old gentleman who comes in today 11 days status post right total hip replacement 04/16/2021.  He is here for wound VAC removal.  He notes that the wound VAC stopped working a couple days ago.  He has been doing well.  He is doing therapy on his own and ambulated with a cane.  He is taking little narcotic pain medication.  Examination of his right hip.  The wound VAC shows a well-healing surgical incision with nylon sutures in place.  No evidence of infection or cellulitis.  Calf is soft nontender.  He is neurovascular intact distally.  At this point, it is too early to remove his sutures.  He will follow-up with [place] next Tuesday or Wednesday for suture removal.  We have placed an Aquacel bandage over the incision for now.  He will call with concerns or questions in the meantime.  Follow-Up Instructions: Return in about 1 year (around 05/01/2022).   Orders:  No orders of the defined types were placed in this encounter.  No orders of the defined types were placed in this encounter.   Imaging: No new imaging  PMFS History: Patient Active Problem List   Diagnosis Date Noted   Status post total replacement of right hip 04/16/2021   Primary osteoarthritis of right hip 06/02/2020   Status post total hip replacement, left 06/02/2020   Suspected COVID-19 virus infection 06/07/2019   HTN (hypertension) 04/14/2019   Status post total replacement of left hip 01/18/2019   Past Medical History:  Diagnosis Date   Anxiety    Arthritis    Depression    Headache    History of  kidney stones    HTN (hypertension) 04/14/2019   Hypertension    MSSA (methicillin susceptible Staphylococcus aureus) infection 04/14/2019   Neck pain    Sleep apnea    to start CPAP after surgery   Suspected COVID-19 virus infection 06/07/2019    No family history on file.  Past Surgical History:  Procedure Laterality Date   ANTERIOR HIP REVISION Left 03/11/2019   Procedure: LEFT HIP IRRIGATION AND DEBRIDEMENT, HEAD AND LINER EXCHANGE;  Surgeon: Tarry Kos, MD;  Location: [facility] OR;  Service: Orthopedics;  Laterality: Left;  Need RNFA   HAND SURGERY Left    infected hand   SHOULDER SURGERY Left    dislocated collor bone   TOTAL HIP ARTHROPLASTY Left 01/18/2019   TOTAL HIP ARTHROPLASTY Left 01/18/2019   Procedure: LEFT TOTAL HIP ARTHROPLASTY ANTERIOR APPROACH;  Surgeon: Tarry Kos, MD;  Location: [facility] OR;  Service: Orthopedics;  Laterality: Left;   TOTAL HIP ARTHROPLASTY Right 04/16/2021   Procedure: RIGTH TOTAL HIP ARTHROPLASTY ANTERIOR APPROACH;  Surgeon: Tarry Kos, MD;  Location: [facility] OR;  Service: Orthopedics;  Laterality: Right;  3-C   Social History   Occupational History   Not on file  Tobacco Use   Smoking status: Never   Smokeless tobacco: Never  Vaping  Use   Vaping Use: Never used  Substance and Sexual Activity   Alcohol use: Not Currently   Drug use: Never   Sexual activity: Not on file

## 2021-05-01 ENCOUNTER — Encounter: Payer: Medicare PPO | Admitting: Orthopaedic Surgery

## 2021-05-02 ENCOUNTER — Ambulatory Visit (INDEPENDENT_AMBULATORY_CARE_PROVIDER_SITE_OTHER): Payer: Medicare PPO | Admitting: Physician Assistant

## 2021-05-02 ENCOUNTER — Other Ambulatory Visit: Payer: Self-pay

## 2021-05-02 ENCOUNTER — Encounter: Payer: Self-pay | Admitting: Orthopaedic Surgery

## 2021-05-02 DIAGNOSIS — Z96641 Presence of right artificial hip joint: Secondary | ICD-10-CM

## 2021-05-02 MED ORDER — OXYCODONE-ACETAMINOPHEN 5-325 MG PO TABS: 1.0000 | ORAL_TABLET | Freq: Three times a day (TID) | ORAL | 0 refills | Status: AC | PRN

## 2021-05-02 NOTE — Progress Notes (Signed)
Post-Op Visit Note   Patient: Dylan [name]           Date of Birth: [birth year]           MRN: [id number] Visit Date: 05/02/2021 PCP: Vanessa Massena, FNP   Assessment & Plan:  Chief Complaint:  Chief Complaint  Patient presents with   Right Hip - Pain   Visit Diagnoses:  1. Status post total hip replacement, right     Plan: Patient is a pleasant 60 year old gentleman who comes in today 2 weeks status post right anterior total hip replacement 04/16/2021.  He has been doing well.  He has been doing physical therapy on his own and is making progress.  He is ambulating without assistance.  Examination of his right hip reveals a well-healed surgical incision with nylon sutures in place.  No evidence of infection or cellulitis.  Calves are soft nontender.  He is neurovascular intact distally.  Today, sutures were removed and Steri-Strips applied.  Dental prophylaxis reinforced.  I refilled his oxycodone.  He will follow-up with [place] in 4 weeks time for repeat evaluation and AP pelvis x-rays.  Call with concerns or questions in the meantime.  Follow-Up Instructions: Return in about 4 weeks (around 05/30/2021).   Orders:  No orders of the defined types were placed in this encounter.  Meds ordered this encounter  Medications   oxyCODONE-acetaminophen (PERCOCET) 5-325 MG tablet    Sig: Take 1-2 tablets by mouth every 8 (eight) hours as needed.    Dispense:  40 tablet    Refill:  0    Imaging: No new imaging  PMFS History: Patient Active Problem List   Diagnosis Date Noted   Status post total replacement of right hip 04/16/2021   Primary osteoarthritis of right hip 06/02/2020   Status post total hip replacement, left 06/02/2020   Suspected COVID-19 virus infection 06/07/2019   HTN (hypertension) 04/14/2019   Status post total replacement of left hip 01/18/2019   Past Medical History:  Diagnosis Date   Anxiety    Arthritis    Depression    Headache    History of kidney  stones    HTN (hypertension) 04/14/2019   Hypertension    MSSA (methicillin susceptible Staphylococcus aureus) infection 04/14/2019   Neck pain    Sleep apnea    to start CPAP after surgery   Suspected COVID-19 virus infection 06/07/2019    History reviewed. No pertinent family history.  Past Surgical History:  Procedure Laterality Date   ANTERIOR HIP REVISION Left 03/11/2019   Procedure: LEFT HIP IRRIGATION AND DEBRIDEMENT, HEAD AND LINER EXCHANGE;  Surgeon: Tarry Kos, MD;  Location: [facility] OR;  Service: Orthopedics;  Laterality: Left;  Need RNFA   HAND SURGERY Left    infected hand   SHOULDER SURGERY Left    dislocated collor bone   TOTAL HIP ARTHROPLASTY Left 01/18/2019   TOTAL HIP ARTHROPLASTY Left 01/18/2019   Procedure: LEFT TOTAL HIP ARTHROPLASTY ANTERIOR APPROACH;  Surgeon: Tarry Kos, MD;  Location: [facility];  Service: Orthopedics;  Laterality: Left;   TOTAL HIP ARTHROPLASTY Right 04/16/2021   Procedure: RIGTH TOTAL HIP ARTHROPLASTY ANTERIOR APPROACH;  Surgeon: Tarry Kos, MD;  Location: [facility] OR;  Service: Orthopedics;  Laterality: Right;  3-C   Social History   Occupational History   Not on file  Tobacco Use   Smoking status: Never   Smokeless tobacco: Never  Vaping Use   Vaping Use: Never used  Substance  and Sexual Activity   Alcohol use: Not Currently   Drug use: Never   Sexual activity: Not on file

## 2021-05-21 IMAGING — DX PORTABLE PELVIS 1-2 VIEWS
1 series · 1 of 1 positions shown · non-contrast
Comparison: 01/18/2019.

CLINICAL DATA: Left hip joint replacement.

EXAM:
PORTABLE PELVIS 1-2 VIEWS

[pelvis ap]
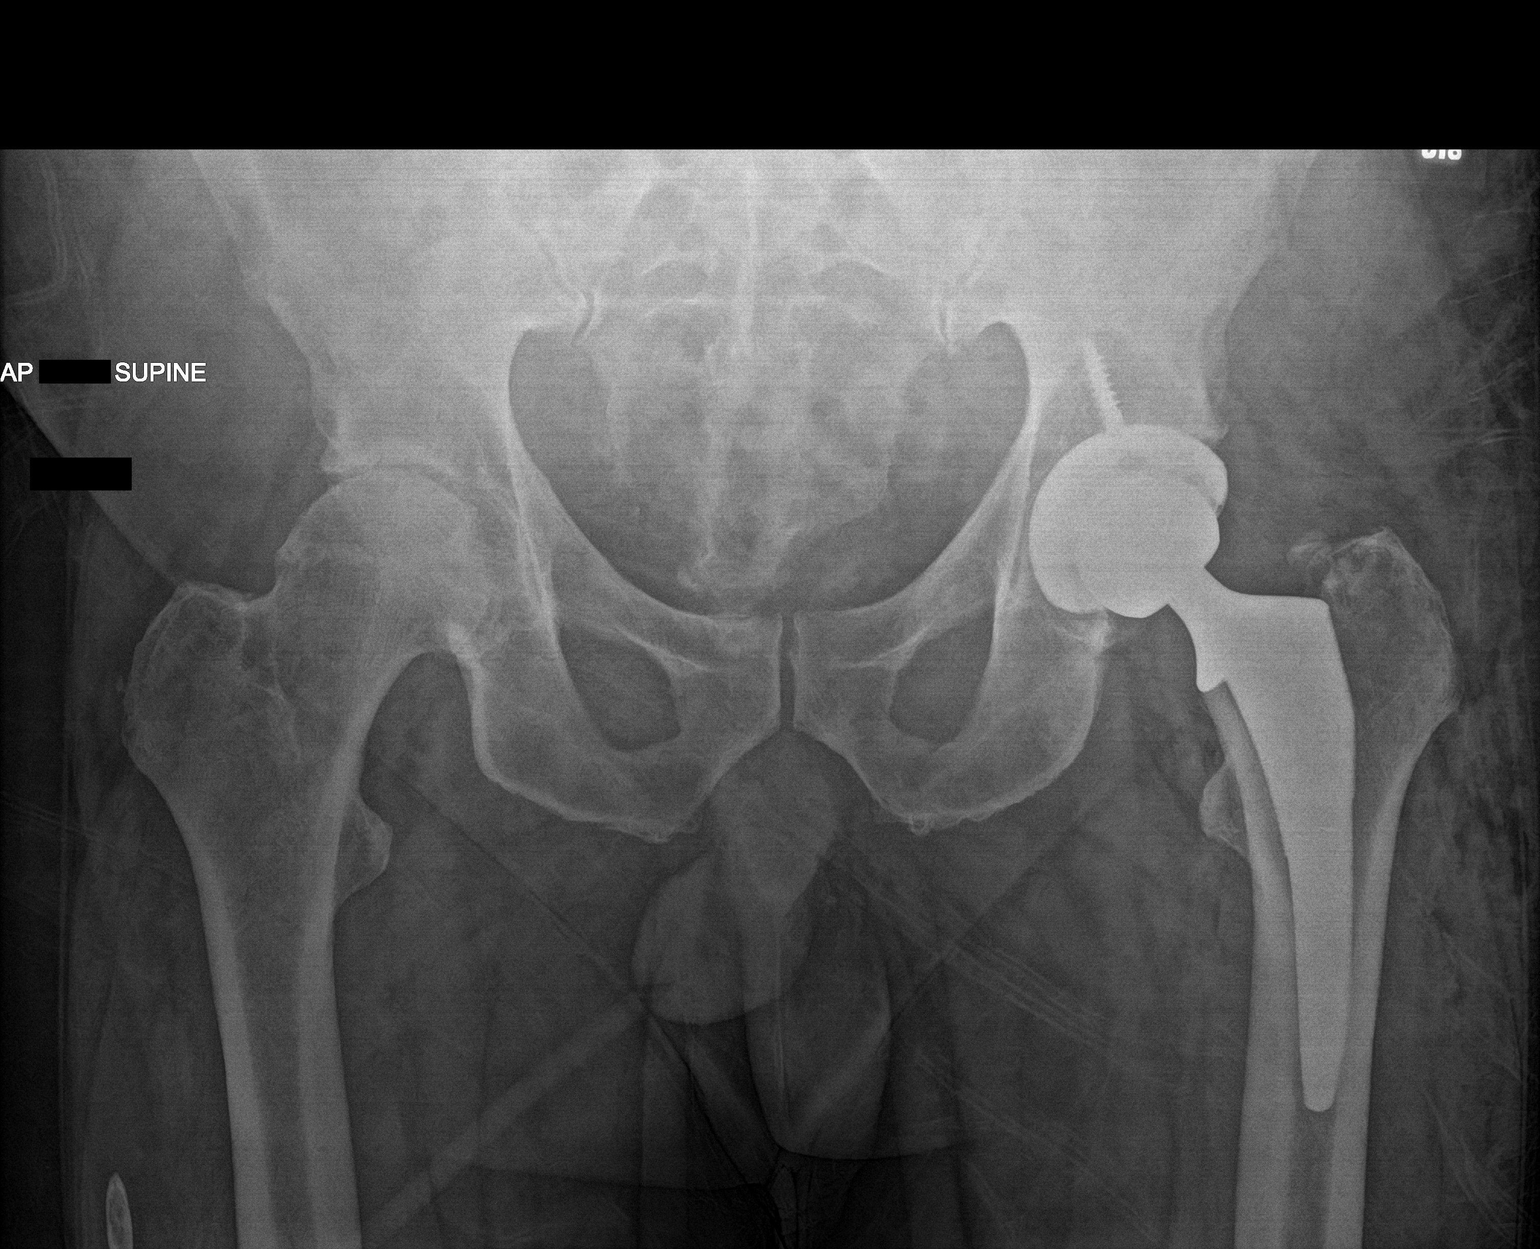

[1 of 1 positions shown; findings below may reference images not displayed]

FINDINGS: Total left hip replacement. Hardware intact. Anatomic alignment.
Degenerative change lumbar spine and right hip.
IMPRESSION: Total left hip replacement with anatomic alignment. No acute bony
abnormality.

## 2021-05-30 ENCOUNTER — Ambulatory Visit: Payer: Medicare PPO | Admitting: Orthopaedic Surgery

## 2021-06-06 ENCOUNTER — Ambulatory Visit: Payer: Medicare PPO | Admitting: Orthopaedic Surgery

## 2021-06-13 ENCOUNTER — Ambulatory Visit: Payer: Medicare PPO | Admitting: Orthopaedic Surgery
